# Patient Record
Sex: Male | Born: 1970 | Race: White | Hispanic: No | Marital: Married | Smoking: Never smoker
Health system: Southern US, Community
[De-identification: ages and names within clinical notes are randomized; demographics above are authoritative.]

## PROBLEM LIST (undated history)

## (undated) DIAGNOSIS — F988 Other specified behavioral and emotional disorders with onset usually occurring in childhood and adolescence: Secondary | ICD-10-CM

## (undated) DIAGNOSIS — I10 Essential (primary) hypertension: Secondary | ICD-10-CM

## (undated) HISTORY — PX: COLONOSCOPY: SHX174

## (undated) HISTORY — DX: Essential (primary) hypertension: I10

## (undated) HISTORY — DX: Other specified behavioral and emotional disorders with onset usually occurring in childhood and adolescence: F98.8

## (undated) HISTORY — PX: WISDOM TOOTH EXTRACTION: SHX21

## (undated) HISTORY — PX: LIPOMA EXCISION: SHX5283

---

## 2013-03-10 ENCOUNTER — Other Ambulatory Visit (HOSPITAL_COMMUNITY): Payer: Self-pay | Admitting: *Deleted

## 2013-03-10 ENCOUNTER — Ambulatory Visit (HOSPITAL_COMMUNITY)
Admission: RE | Admit: 2013-03-10 | Discharge: 2013-03-10 | Disposition: A | Discharge status: Home / Self Care | Payer: BC Managed Care – PPO | Source: Ambulatory Visit | Attending: *Deleted | Admitting: *Deleted

## 2013-03-10 DIAGNOSIS — R2232 Localized swelling, mass and lump, left upper limb: Secondary | ICD-10-CM

## 2013-03-10 DIAGNOSIS — R229 Localized swelling, mass and lump, unspecified: Secondary | ICD-10-CM | POA: Insufficient documentation

## 2013-03-11 ENCOUNTER — Ambulatory Visit (INDEPENDENT_AMBULATORY_CARE_PROVIDER_SITE_OTHER): Payer: BC Managed Care – PPO | Admitting: Surgery

## 2013-03-11 ENCOUNTER — Encounter (INDEPENDENT_AMBULATORY_CARE_PROVIDER_SITE_OTHER): Payer: Self-pay | Admitting: Surgery

## 2013-03-11 VITALS — BP 125/77 | HR 70 | Temp 97.8°F | Resp 14 | Ht 70.0 in | Wt 230.8 lb

## 2013-03-11 DIAGNOSIS — D171 Benign lipomatous neoplasm of skin and subcutaneous tissue of trunk: Secondary | ICD-10-CM

## 2013-03-11 DIAGNOSIS — D1779 Benign lipomatous neoplasm of other sites: Secondary | ICD-10-CM

## 2013-03-11 NOTE — Progress Notes (Signed)
Patient ID: Tom Wilson, male   DOB: [birth year], 42 y.o.   MRN: [id number]  Chief Complaint  Patient presents with  . Lipoma    HPI Tom Wilson is a 42 y.o. male.  PCP - Dr. Catalina Pizza HPI 42 yo male in good health presents with several weeks of waking up with some transient numbness in his left arm after sleeping on his left shoulder.  He has noticed a lump on his posterior left shoulder.  This was evaluated with an ultrasound and appears to be a subcutaneous lipoma.  He presents now to discuss excision.  Past Medical History  Diagnosis Date  . ADD (attention deficit disorder)     History reviewed. No pertinent past surgical history.  History reviewed. No pertinent family history.  Social History History  Substance Use Topics  . Smoking status: Never Smoker   . Smokeless tobacco: Not on file  . Alcohol Use: No    Not on File  Current Outpatient Prescriptions  Medication Sig Dispense Refill  . methylphenidate (CONCERTA) 27 MG CR tablet Take 27 mg by mouth every morning.      . methylphenidate (CONCERTA) 54 MG CR tablet Take 54 mg by mouth every morning.       No current facility-administered medications for this visit.    Review of Systems Review of Systems  Constitutional: Negative for fever, chills and unexpected weight change.  HENT: Negative for hearing loss, congestion, sore throat, trouble swallowing and voice change.   Eyes: Negative for visual disturbance.  Respiratory: Negative for cough and wheezing.   Cardiovascular: Negative for chest pain, palpitations and leg swelling.  Gastrointestinal: Negative for nausea, vomiting, abdominal pain, diarrhea, constipation, blood in stool, abdominal distention, anal bleeding and rectal pain.  Genitourinary: Negative for hematuria and difficulty urinating.  Musculoskeletal: Negative for arthralgias.  Skin: Negative for rash and wound.  Neurological: Negative for seizures, syncope, weakness and headaches.  Hematological:  Negative for adenopathy. Does not bruise/bleed easily.  Psychiatric/Behavioral: Negative for confusion.    Blood pressure 125/77, pulse 70, temperature 97.8 F (36.6 C), temperature source Temporal, resp. rate 14, height 5\' 10"  (1.778 m), weight 230 lb 12.8 oz (104.69 kg).  Physical Exam Physical Exam WDWN in NAD Left posterior shoulder - 4 cm palpable subcutaneous mass; no overlying skin changes  Data Reviewed RADIOLOGY REPORT*  Clinical Data: Soft tissue mass at left shoulder region  ULTRASOUND OF HEAD/NECK SOFT TISSUES  Technique: Ultrasound examination of the head and neck soft  tissues was performed in the area of clinical concern.  Comparison: None.  Findings:  At the site of clinical concern, a hypoechoic heterogeneous nodule  is identified measuring the 4.8 x 1.5 x 2.9 cm.  Mass has sonographic characteristics most typical of fat and is  most consistent with a subcutaneous lipoma.  Mass shows no significant vascularity on color Doppler imaging.  No calcifications or cystic foci identified.  IMPRESSION:  4.8 x 1.5 x 2.9 cm diameter heterogeneous subcutaneous mass at site  of palpable concern at the left shoulder region most consistent  with a subcutaneous lipoma.  Original Report Authenticated By: Ulyses Southward, M.D.   Assessment    Subcutaneous lipoma - left posterior shoulder     Plan    Excision under local anesthetic in the office.  We will schedule this for next week.         Denijah Karrer K. 03/11/2013, 11:50 AM

## 2013-03-17 ENCOUNTER — Encounter (INDEPENDENT_AMBULATORY_CARE_PROVIDER_SITE_OTHER): Payer: Self-pay | Admitting: Surgery

## 2013-03-17 ENCOUNTER — Ambulatory Visit (INDEPENDENT_AMBULATORY_CARE_PROVIDER_SITE_OTHER): Payer: BC Managed Care – PPO | Admitting: Surgery

## 2013-03-17 DIAGNOSIS — D1779 Benign lipomatous neoplasm of other sites: Secondary | ICD-10-CM

## 2013-03-17 DIAGNOSIS — D171 Benign lipomatous neoplasm of skin and subcutaneous tissue of trunk: Secondary | ICD-10-CM

## 2013-03-17 MED ORDER — HYDROCODONE-ACETAMINOPHEN 5-325 MG PO TABS: 1.0000 | ORAL_TABLET | ORAL | Status: DC | PRN

## 2013-03-17 NOTE — Progress Notes (Signed)
Pre-op Diagnosis:  Subcutaneous lipoma - left shoulder - 5 cm Post-op Diagnosis:  Same Procedure:  Excision of lipoma - posterior left shoulder Surgeon:  Arlinda Barcelona K. Anesthesia:  Local Indications:  43 yo male presents with slowly enlarging mass in posterior left shoulder with some pain/ numbness running down his left arm.  He presents for excision of lipoma.  Description of procedure:  The patient was positioned on his right side in the procedure room.  His left shoulder was prepped with Betadine and draped in sterile fashion. We anesthetized the skin and subcutaneous tissues around the lipoma with 0.25% Marcaine with epinephrine. We made a transverse incision across the lipoma. We dissected bluntly down to the surface of the lipoma. We then bluntly dissected are completely around the lipoma. We had to sharply dissect the capsule off of the underlying muscle. We were able to remove the entire lobulated lipoma. There was a small appendage of lipoma extending superiorly which was also removed. We inspected the cavity for hemostasis. The wound was closed with a deep layer of 3-0 Vicryl and a subcuticular layer of 4-0 Monocryl. Steri-Strips and an occlusive dressing were applied. The patient tolerated the procedure well.  Followup when necessary. He may remove the dressing in 2 days and may shower over the Steri-Strips. His wife is a Engineer, civil (consulting) and will contact us if there are any signs of infection.  Wilmon Arms. Corliss Skains, MD, [facility]  General/ Trauma Surgery  03/17/2013 12:09 PM

## 2013-10-20 ENCOUNTER — Emergency Department (HOSPITAL_COMMUNITY)
Admission: EM | Admit: 2013-10-20 | Discharge: 2013-10-20 | Disposition: A | Discharge status: Home / Self Care | Payer: BC Managed Care – PPO | Source: Home / Self Care | Attending: Family Medicine | Admitting: Family Medicine

## 2013-10-20 ENCOUNTER — Encounter (HOSPITAL_COMMUNITY): Payer: Self-pay | Admitting: Emergency Medicine

## 2013-10-20 DIAGNOSIS — S96819A Strain of other specified muscles and tendons at ankle and foot level, unspecified foot, initial encounter: Secondary | ICD-10-CM

## 2013-10-20 DIAGNOSIS — S86019A Strain of unspecified Achilles tendon, initial encounter: Secondary | ICD-10-CM

## 2013-10-20 DIAGNOSIS — S93499A Sprain of other ligament of unspecified ankle, initial encounter: Secondary | ICD-10-CM

## 2013-10-20 DIAGNOSIS — IMO0002 Reserved for concepts with insufficient information to code with codable children: Secondary | ICD-10-CM

## 2013-10-20 DIAGNOSIS — Y9375 Activity, martial arts: Secondary | ICD-10-CM

## 2013-10-20 NOTE — Discharge Instructions (Signed)
Thank you for coming in today. Keep the foot elevated if possible. Use the boot.  Followup with orthopedics soon.   Achilles Tendon Rupture  A complete tear in the Achilles tendon is known as an Achilles tendon rupture. The Achilles tendon, which is also known as the heel cord, connects the large calf muscles (gastrocnemius and soleus) to the heel bone (calcaneus) and is essential for proper functioning of the calf muscles. The calf muscles are required for pushing the foot downward and are necessary for walking, running, and jumping. SYMPTOMS   A "pop" or tear may be felt at the time of injury.  Pain and weakness during movement, especially when raising the heel.  Tenderness, swelling, warmth, and redness around the Achilles tendon.  Bruising (contusion) around the back of the ankle after 48 hours.  Loss of firmness to the touch over the area of the Achilles tendon that ruptured. CAUSES   Achilles tendon rupture is most commonly caused by a sudden force placed upon the Achilles tendon that is greater than the tendon can withstand (for example jumping, hurdling, or sprinting).  Achilles tendon rupture may also occur from direct trauma or injury to the lower leg, foot, or ankle. RISK INCREASES WITH:  Physical activity that involves sudden muscle contraction.  Poor strength and flexibility of the lower leg.  Previous injury to the tendon.  Untreated Achilles tendinitis.  Steroid injection into the Achilles tendon.  Medical conditions, such as poor circulation due to a cardiovascular condition or obesity. PREVENTION   Include a proper warm-up and stretching routine before physical activity.  Allow for rest and recovery between physical activity.  Maintain lower leg fitness:  Flexibility.  Muscle strength.  Muscular endurance.  Cardiovascular fitness.  Mechanical prevention:  Taping.  Protective strapping.  An adhesive bandaging that has been recommended prior  to physical activity. TREATMENT  Initially one should not walk on the affected leg. One should also ice the injured area, apply compression with an elastic bandage, and elevate the injured leg to eye level. Both surgical and nonsurgical interventions exist for definitive treatment. The return to sports is usually about the same with either treatment course but can occur a few weeks sooner with surgery.   Nonsurgical treatment typically requires the immobilization in the form of a long leg cast (from toes to groin) for 4 to 9 weeks. The long leg cast is followed by a short leg cast or walking boot for an additional 4 to 12 weeks.  Advantages: Non-surgical treatment lacks the risks involved with anesthesia or surgery (such as infection, bleeding, or injury to nerves).  Disadvantages: Non-surgical treatment involves a longer period of immobilization. This may result in stiffer ankle and knee joints. Also, the calf muscles are slightly weaker and the risk of repeat rupture is higher.  Surgical treatment typically requires sewing the ends of the tendon back together, followed by immobilization (typically a short leg cast or walking boot).  Advantages: Surgical treatment does not usually require the immobilization of the knee. Surgery also offers a lower risk of repeat rupture of the tendon and slightly stronger calf muscles.  Disadvantages: Surgical treatment does include the risks of anesthesia and surgery. These risks include impaired wound healing and injury to a nerve that provides sensation to the side of the foot. PROGNOSIS   Achilles tendon ruptures are typically curable if treated correctly.  A period of 4 to 9 months is typical before a return to sports. RELATED COMPLICATIONS   Calf muscle  weakness may occur, especially if the rupture goes untreated.  The possibility of repeat rupture exists despite treatment.  Prolonged disability can occur.  Risks of surgery include infection,  bleeding, injury to nerves, and impaired wound healing. MEDICATION   If pain medication is necessary, then nonsteroidal anti-inflammatory medications, such as aspirin and ibuprofen, or other minor pain relievers, such as acetaminophen, are often recommended.  Do not take pain medication within 7 days before surgery.  All medications should be taken under the direction of your caregiver. Contact your caregiver immediately if any bleeding, stomach upset, or signs of allergic reaction occur.  Prescription pain medication may be prescribed as necessary by your caregiver. Use only as directed and only as much as you need. SEEK MEDICAL CARE IF:   Pain continues to increase, despite treatment.  Cast discomfort develops.  New, unexplained symptoms develop.  You are experiencing any side effects form the drugs used in treatment.

## 2013-10-20 NOTE — ED Provider Notes (Signed)
Tom Wilson is a 43 y.o. male who presents to Urgent Care today for Achilles tendon. Patient was sparring in [place] today. He planted with his left foot and felt a pop in the distal Achilles tendon. He had immediate pain and inability to cannulate. He's concerned he may have ruptured his Achilles tendon. He denies any radiating pain weakness or numbness. He denies any prior injury. He's tried ice and use crutches to ambulate. He has not tried any medications yet.   Past Medical History  Diagnosis Date  . ADD (attention deficit disorder)    History  Substance Use Topics  . Smoking status: Never Smoker   . Smokeless tobacco: Not on file  . Alcohol Use: No   ROS as above Medications: No current facility-administered medications for this encounter.   Current Outpatient Prescriptions  Medication Sig Dispense Refill  . HYDROcodone-acetaminophen (NORCO/VICODIN) 5-325 MG per tablet Take 1 tablet by mouth every 4 (four) hours as needed for pain.  40 tablet  0  . methylphenidate (CONCERTA) 27 MG CR tablet Take 27 mg by mouth every morning.      . methylphenidate (CONCERTA) 54 MG CR tablet Take 54 mg by mouth every morning.        Exam:  BP 140/94  Pulse 65  Temp(Src) 98.3 F (36.8 C) (Oral)  Resp 18  SpO2 98% Gen: Well NAD Left Achilles tendon. No ecchymosis present. Palpable defect approximately 5 cm proximal to the insertion at the calcaneus. No foot motion with calf squeeze. Capillary refill sensation and pulses are intact distally.  Limited musculoskeletal ultrasound left Achilles tendon: Full-thickness defect seen about 5 cm proximal to the calcaneal insertion consistent with Achilles tendon rupture    Assessment and Plan: 43 y.o. male with Achilles tendon rupture. Plan for cam walker boot, crutches, followup with orthopedics in.  Discussed warning signs or symptoms. Please see discharge instructions. Patient expresses understanding.    Gregor Hams, MD 10/20/13 [phone]

## 2013-10-20 NOTE — ED Notes (Signed)
C/o left ankle injury See physician note

## 2013-11-01 ENCOUNTER — Encounter (HOSPITAL_BASED_OUTPATIENT_CLINIC_OR_DEPARTMENT_OTHER): Payer: Self-pay | Admitting: *Deleted

## 2013-11-01 NOTE — Progress Notes (Signed)
No health problems  

## 2013-11-02 ENCOUNTER — Other Ambulatory Visit: Payer: Self-pay | Admitting: Orthopedic Surgery

## 2013-11-02 NOTE — H&P (Signed)
Tom Wilson is an 43 y.o. male.   Chief Complaint: Left posterior ankle pain HPI: Pt reports to OR for surgical repair of his left achilles tendon rupture. Pt was in Taekwondo practice when he felt a sharp stabbing pain to the left achilles.  Pt denies N/V/F/C, chest pain, SOB, calf pain, or paresthesia b/l.  Past Medical History  Diagnosis Date  . ADD (attention deficit disorder)     Past Surgical History  Procedure Laterality Date  . Colonoscopy    . Wisdom tooth extraction    . Lipoma excision      History reviewed. No pertinent family history. Social History:  reports that he has never smoked. He does not have any smokeless tobacco history on file. He reports that he does not drink alcohol or use illicit drugs.  Allergies: No Known Allergies  No prescriptions prior to admission    No results found for this or any previous visit (from the past 48 hour(s)). No results found.  Review of Systems  Constitutional: Negative.   HENT: Negative.   Eyes: Negative.   Respiratory: Negative.   Cardiovascular: Negative.   Gastrointestinal: Negative.   Musculoskeletal: Negative.   Skin: Negative.   Neurological: Negative for tremors.  Endo/Heme/Allergies: Negative.   Psychiatric/Behavioral: The patient is not nervous/anxious.     Height 5\' 10"  (1.778 m), weight 104.327 kg (230 lb). Physical Exam  WD WN 43y/o male in NAD, A/Ox3, appears stated age.  EOMI, mood and affect normal, respirations unlabored. Pt is non-ambulatory due to pain. +TTP to area of posterior left ankle. Palpable defect noted just below the musculo-tendonus junction of the left gastroc and achilles. +Soft tissue edema noted to area of palbale defect. +Thompson test. Dorsalis pedis pulses 2+ b/l. Normal sensation to light touch intact.  Skin healthy and intact, no lymphadenopathy noted on exam. Assessment/Plan Pt reports to OR for surgical repair of left achilles rupture.   Judeth Cornfield Larkyn Greenberger 11/02/2013, 10:24  AM

## 2013-11-03 ENCOUNTER — Encounter (HOSPITAL_BASED_OUTPATIENT_CLINIC_OR_DEPARTMENT_OTHER): Payer: BC Managed Care – PPO | Admitting: Certified Registered"

## 2013-11-03 ENCOUNTER — Encounter (HOSPITAL_BASED_OUTPATIENT_CLINIC_OR_DEPARTMENT_OTHER)
Admission: RE | Disposition: A | Discharge status: Home / Self Care | Payer: Self-pay | Source: Ambulatory Visit | Attending: Orthopedic Surgery

## 2013-11-03 ENCOUNTER — Ambulatory Visit (HOSPITAL_BASED_OUTPATIENT_CLINIC_OR_DEPARTMENT_OTHER): Payer: BC Managed Care – PPO | Admitting: Certified Registered"

## 2013-11-03 ENCOUNTER — Ambulatory Visit (HOSPITAL_BASED_OUTPATIENT_CLINIC_OR_DEPARTMENT_OTHER)
Admission: RE | Admit: 2013-11-03 | Discharge: 2013-11-03 | Disposition: A | Discharge status: Home / Self Care | Payer: BC Managed Care – PPO | Source: Ambulatory Visit | Attending: Orthopedic Surgery | Admitting: Orthopedic Surgery

## 2013-11-03 ENCOUNTER — Encounter (HOSPITAL_BASED_OUTPATIENT_CLINIC_OR_DEPARTMENT_OTHER): Payer: Self-pay

## 2013-11-03 DIAGNOSIS — X500XXA Overexertion from strenuous movement or load, initial encounter: Secondary | ICD-10-CM | POA: Insufficient documentation

## 2013-11-03 DIAGNOSIS — Y9375 Activity, martial arts: Secondary | ICD-10-CM | POA: Insufficient documentation

## 2013-11-03 DIAGNOSIS — S93499A Sprain of other ligament of unspecified ankle, initial encounter: Secondary | ICD-10-CM | POA: Insufficient documentation

## 2013-11-03 DIAGNOSIS — S96819A Strain of other specified muscles and tendons at ankle and foot level, unspecified foot, initial encounter: Principal | ICD-10-CM

## 2013-11-03 DIAGNOSIS — F988 Other specified behavioral and emotional disorders with onset usually occurring in childhood and adolescence: Secondary | ICD-10-CM | POA: Insufficient documentation

## 2013-11-03 DIAGNOSIS — S86012A Strain of left Achilles tendon, initial encounter: Secondary | ICD-10-CM

## 2013-11-03 HISTORY — PX: ACHILLES TENDON SURGERY: SHX542

## 2013-11-03 LAB — POCT HEMOGLOBIN-HEMACUE: HEMOGLOBIN: 13.7 g/dL (ref 13.0–17.0)

## 2013-11-03 SURGERY — REPAIR, TENDON, ACHILLES
Anesthesia: Regional | Site: Foot | Laterality: Left

## 2013-11-03 MED ORDER — 0.9 % SODIUM CHLORIDE (POUR BTL) OPTIME
TOPICAL | Status: DC | PRN
  Administered 2013-11-03: 200 mL

## 2013-11-03 MED ORDER — HYDROCODONE-ACETAMINOPHEN 5-325 MG PO TABS: 1.0000 | ORAL_TABLET | Freq: Four times a day (QID) | ORAL | Status: DC | PRN

## 2013-11-03 MED ORDER — CEFAZOLIN SODIUM-DEXTROSE 2-3 GM-% IV SOLR
INTRAVENOUS | Status: AC
  Filled 2013-11-03: qty 50

## 2013-11-03 MED ORDER — MIDAZOLAM HCL 2 MG/2ML IJ SOLN
1.0000 mg | INTRAMUSCULAR | Status: DC | PRN
  Administered 2013-11-03: 2 mg via INTRAVENOUS

## 2013-11-03 MED ORDER — ONDANSETRON HCL 4 MG/2ML IJ SOLN
INTRAMUSCULAR | Status: DC | PRN
  Administered 2013-11-03: 4 mg via INTRAVENOUS

## 2013-11-03 MED ORDER — PROPOFOL 10 MG/ML IV EMUL
INTRAVENOUS | Status: AC
  Filled 2013-11-03: qty 50

## 2013-11-03 MED ORDER — CHLORHEXIDINE GLUCONATE 4 % EX LIQD: 60.0000 mL | Freq: Once | CUTANEOUS | Status: DC

## 2013-11-03 MED ORDER — CEFAZOLIN SODIUM-DEXTROSE 2-3 GM-% IV SOLR
2.0000 g | INTRAVENOUS | Status: AC
  Administered 2013-11-03: 2 g via INTRAVENOUS

## 2013-11-03 MED ORDER — BUPIVACAINE HCL (PF) 0.5 % IJ SOLN
INTRAMUSCULAR | Status: DC | PRN
  Administered 2013-11-03: 10 mL

## 2013-11-03 MED ORDER — DEXAMETHASONE SODIUM PHOSPHATE 4 MG/ML IJ SOLN
INTRAMUSCULAR | Status: DC | PRN
  Administered 2013-11-03: 10 mg via INTRAVENOUS

## 2013-11-03 MED ORDER — OXYCODONE HCL 5 MG/5ML PO SOLN: 5.0000 mg | Freq: Once | ORAL | Status: DC | PRN

## 2013-11-03 MED ORDER — FENTANYL CITRATE 0.05 MG/ML IJ SOLN
INTRAMUSCULAR | Status: AC
  Filled 2013-11-03: qty 2

## 2013-11-03 MED ORDER — PROPOFOL 10 MG/ML IV BOLUS
INTRAVENOUS | Status: DC | PRN
  Administered 2013-11-03: 200 mg via INTRAVENOUS

## 2013-11-03 MED ORDER — MIDAZOLAM HCL 2 MG/2ML IJ SOLN
INTRAMUSCULAR | Status: AC
  Filled 2013-11-03: qty 2

## 2013-11-03 MED ORDER — HYDROMORPHONE HCL PF 1 MG/ML IJ SOLN: 0.2500 mg | INTRAMUSCULAR | Status: DC | PRN

## 2013-11-03 MED ORDER — LIDOCAINE HCL (CARDIAC) 20 MG/ML IV SOLN
INTRAVENOUS | Status: DC | PRN
  Administered 2013-11-03: 30 mg via INTRAVENOUS

## 2013-11-03 MED ORDER — SODIUM CHLORIDE 0.9 % IV SOLN: INTRAVENOUS | Status: DC

## 2013-11-03 MED ORDER — SUCCINYLCHOLINE CHLORIDE 20 MG/ML IJ SOLN
INTRAMUSCULAR | Status: DC | PRN
  Administered 2013-11-03: 100 mg via INTRAVENOUS

## 2013-11-03 MED ORDER — BUPIVACAINE-EPINEPHRINE (PF) 0.5% -1:200000 IJ SOLN
INTRAMUSCULAR | Status: DC | PRN
  Administered 2013-11-03: 30 mL

## 2013-11-03 MED ORDER — FENTANYL CITRATE 0.05 MG/ML IJ SOLN
50.0000 ug | INTRAMUSCULAR | Status: DC | PRN
  Administered 2013-11-03: 100 ug via INTRAVENOUS

## 2013-11-03 MED ORDER — BUPIVACAINE-EPINEPHRINE PF 0.5-1:200000 % IJ SOLN
INTRAMUSCULAR | Status: AC
  Filled 2013-11-03: qty 30

## 2013-11-03 MED ORDER — OXYCODONE HCL 5 MG PO TABS: 5.0000 mg | ORAL_TABLET | Freq: Once | ORAL | Status: DC | PRN

## 2013-11-03 MED ORDER — MIDAZOLAM HCL 5 MG/5ML IJ SOLN
INTRAMUSCULAR | Status: DC | PRN
  Administered 2013-11-03: 2 mg via INTRAVENOUS

## 2013-11-03 MED ORDER — FENTANYL CITRATE 0.05 MG/ML IJ SOLN
INTRAMUSCULAR | Status: AC
  Filled 2013-11-03: qty 6

## 2013-11-03 MED ORDER — BACITRACIN ZINC 500 UNIT/GM EX OINT
TOPICAL_OINTMENT | CUTANEOUS | Status: DC | PRN
  Administered 2013-11-03: 1 via TOPICAL

## 2013-11-03 MED ORDER — LACTATED RINGERS IV SOLN
INTRAVENOUS | Status: DC
  Administered 2013-11-03 (×2): via INTRAVENOUS

## 2013-11-03 SURGICAL SUPPLY — 74 items
BANDAGE ESMARK 6X9 LF (GAUZE/BANDAGES/DRESSINGS) ×1 IMPLANT
BLADE 15 SAFETY STRL DISP (BLADE) ×2 IMPLANT
BLADE AVERAGE 25X9 (BLADE) IMPLANT
BNDG COHESIVE 4X5 TAN STRL (GAUZE/BANDAGES/DRESSINGS) ×2 IMPLANT
BNDG COHESIVE 6X5 TAN STRL LF (GAUZE/BANDAGES/DRESSINGS) ×2 IMPLANT
BNDG ESMARK 6X9 LF (GAUZE/BANDAGES/DRESSINGS) ×2
BOOT STEPPER DURA MED (SOFTGOODS) IMPLANT
CANISTER SUCT 1200ML W/VALVE (MISCELLANEOUS) ×2 IMPLANT
CHLORAPREP W/TINT 26ML (MISCELLANEOUS) ×2 IMPLANT
COVER TABLE BACK 60X90 (DRAPES) ×2 IMPLANT
CUFF TOURNIQUET SINGLE 34IN LL (TOURNIQUET CUFF) ×2 IMPLANT
DRAPE EXTREMITY T 121X128X90 (DRAPE) ×2 IMPLANT
DRAPE OEC MINIVIEW 54X84 (DRAPES) IMPLANT
DRAPE U-SHAPE 47X51 STRL (DRAPES) ×2 IMPLANT
DRSG EMULSION OIL 3X3 NADH (GAUZE/BANDAGES/DRESSINGS) ×2 IMPLANT
DRSG PAD ABDOMINAL 8X10 ST (GAUZE/BANDAGES/DRESSINGS) ×4 IMPLANT
ELECT REM PT RETURN 9FT ADLT (ELECTROSURGICAL) ×2
ELECTRODE REM PT RTRN 9FT ADLT (ELECTROSURGICAL) ×1 IMPLANT
GAUZE SPONGE 4X4 12PLY STRL (GAUZE/BANDAGES/DRESSINGS) ×2 IMPLANT
GLOVE BIO SURGEON STRL SZ8 (GLOVE) ×2 IMPLANT
GLOVE BIOGEL PI IND STRL 7.0 (GLOVE) ×1 IMPLANT
GLOVE BIOGEL PI IND STRL 7.5 (GLOVE) IMPLANT
GLOVE BIOGEL PI IND STRL 8 (GLOVE) ×1 IMPLANT
GLOVE BIOGEL PI INDICATOR 7.0 (GLOVE) ×1
GLOVE BIOGEL PI INDICATOR 7.5 (GLOVE)
GLOVE BIOGEL PI INDICATOR 8 (GLOVE) ×1
GLOVE ECLIPSE 6.5 STRL STRAW (GLOVE) ×2 IMPLANT
GLOVE ECLIPSE 7.0 STRL STRAW (GLOVE) IMPLANT
GLOVE EXAM NITRILE MD LF STRL (GLOVE) ×2 IMPLANT
GOWN STRL REUS W/ TWL LRG LVL3 (GOWN DISPOSABLE) ×1 IMPLANT
GOWN STRL REUS W/ TWL XL LVL3 (GOWN DISPOSABLE) ×1 IMPLANT
GOWN STRL REUS W/TWL LRG LVL3 (GOWN DISPOSABLE) ×1
GOWN STRL REUS W/TWL XL LVL3 (GOWN DISPOSABLE) ×1
KIT BIO-TENODESIS 3X8 DISP (MISCELLANEOUS)
KIT IMPLANT SUTURE PARS (Kit) ×2 IMPLANT
KIT INSRT BABSR STRL DISP BTN (MISCELLANEOUS) IMPLANT
NDL SAFETY ECLIPSE 18X1.5 (NEEDLE) IMPLANT
NDL SUT 6 .5 CRC .975X.05 MAYO (NEEDLE) IMPLANT
NEEDLE HYPO 18GX1.5 SHARP (NEEDLE)
NEEDLE HYPO 22GX1.5 SAFETY (NEEDLE) IMPLANT
NEEDLE MAYO TAPER (NEEDLE)
PACK BASIN DAY SURGERY FS (CUSTOM PROCEDURE TRAY) ×2 IMPLANT
PAD CAST 4YDX4 CTTN HI CHSV (CAST SUPPLIES) ×1 IMPLANT
PADDING CAST ABS 4INX4YD NS (CAST SUPPLIES)
PADDING CAST ABS COTTON 4X4 ST (CAST SUPPLIES) IMPLANT
PADDING CAST COTTON 4X4 STRL (CAST SUPPLIES) ×1
PADDING CAST COTTON 6X4 STRL (CAST SUPPLIES) ×2 IMPLANT
PENCIL BUTTON HOLSTER BLD 10FT (ELECTRODE) ×2 IMPLANT
SANITIZER HAND PURELL 535ML FO (MISCELLANEOUS) ×2 IMPLANT
SHEET MEDIUM DRAPE 40X70 STRL (DRAPES) ×2 IMPLANT
SLEEVE SCD COMPRESS KNEE MED (MISCELLANEOUS) ×2 IMPLANT
SPLINT FAST PLASTER 5X30 (CAST SUPPLIES) ×20
SPLINT PLASTER CAST FAST 5X30 (CAST SUPPLIES) ×20 IMPLANT
SPONGE LAP 18X18 X RAY DECT (DISPOSABLE) ×2 IMPLANT
STAPLER VISISTAT 35W (STAPLE) IMPLANT
STOCKINETTE 6  STRL (DRAPES) ×1
STOCKINETTE 6 STRL (DRAPES) ×1 IMPLANT
SUCTION FRAZIER TIP 10 FR DISP (SUCTIONS) IMPLANT
SUT 2 FIBERLOOP 20 STRT BLUE (SUTURE)
SUT ETHIBOND 2 OS 4 DA (SUTURE) ×2 IMPLANT
SUT ETHILON 3 0 PS 1 (SUTURE) ×2 IMPLANT
SUT FIBERWIRE #2 38 T-5 BLUE (SUTURE)
SUT MNCRL AB 3-0 PS2 18 (SUTURE) ×2 IMPLANT
SUT VIC AB 0 SH 27 (SUTURE) ×2 IMPLANT
SUT VIC AB 2-0 SH 27 (SUTURE)
SUT VIC AB 2-0 SH 27XBRD (SUTURE) IMPLANT
SUT VICRYL 4-0 PS2 18IN ABS (SUTURE) IMPLANT
SUTURE 2 FIBERLOOP 20 STRT BLU (SUTURE) IMPLANT
SUTURE FIBERWR #2 38 T-5 BLUE (SUTURE) IMPLANT
SYR BULB 3OZ (MISCELLANEOUS) ×2 IMPLANT
TOWEL OR 17X24 6PK STRL BLUE (TOWEL DISPOSABLE) ×2 IMPLANT
TOWEL OR NON WOVEN STRL DISP B (DISPOSABLE) IMPLANT
TUBE CONNECTING 20X1/4 (TUBING) ×2 IMPLANT
UNDERPAD 30X30 INCONTINENT (UNDERPADS AND DIAPERS) ×2 IMPLANT

## 2013-11-03 NOTE — Anesthesia Preprocedure Evaluation (Addendum)
Anesthesia Evaluation  Patient identified by MRN, date of birth, ID band Patient awake    Reviewed: Allergy & Precautions, H&P , NPO status , Patient's Chart, lab work & pertinent test results  Airway Mallampati: II TM Distance: >3 FB Neck ROM: Full    Dental no notable dental hx. (+) Teeth Intact, Dental Advisory Given   Pulmonary neg pulmonary ROS,  breath sounds clear to auscultation  Pulmonary exam normal       Cardiovascular negative cardio ROS  Rhythm:Regular Rate:Normal     Neuro/Psych negative neurological ROS  negative psych ROS   GI/Hepatic negative GI ROS, Neg liver ROS,   Endo/Other  negative endocrine ROS  Renal/GU negative Renal ROS  negative genitourinary   Musculoskeletal   Abdominal   Peds  (+) ATTENTION DEFICIT DISORDER WITHOUT HYPERACTIVITY Hematology negative hematology ROS (+)   Anesthesia Other Findings   Reproductive/Obstetrics negative OB ROS                          Anesthesia Physical Anesthesia Plan  ASA: II  Anesthesia Plan: General and Regional   Post-op Pain Management:    Induction: Intravenous  Airway Management Planned: Oral ETT  Additional Equipment:   Intra-op Plan:   Post-operative Plan: Extubation in OR  Informed Consent: I have reviewed the patients History and Physical, chart, labs and discussed the procedure including the risks, benefits and alternatives for the proposed anesthesia with the patient or authorized representative who has indicated his/her understanding and acceptance.   Dental advisory given  Plan Discussed with: CRNA  Anesthesia Plan Comments:         Anesthesia Quick Evaluation

## 2013-11-03 NOTE — Discharge Instructions (Signed)
Wylene Simmer, MD [place]  Please read the following information regarding your care after surgery.  Medications  You only need a prescription for the narcotic pain medicine (ex. oxycodone, Percocet, Norco).  All of the other medicines listed below are available over the counter. X acetominophen (Tylenol) 650 mg every 4-6 hours as you need for minor pain OR Norco as prescribed for moderate to severe pain  Narcotic pain medicine (ex. oxycodone, Percocet, Vicodin) will cause constipation.  To prevent this problem, take the following medicines while you are taking any pain medicine. X docusate sodium (Colace) 100 mg twice a day X senna (Senokot) 2 tablets twice a day  X To help prevent blood clots, take an aspirin (325 mg) once a day for a month after surgery.  You should also get up every hour while you are awake to move around.    Weight Bearing ? Bear weight when you are able on your operated leg or foot. ? Bear weight only on the heel of your operated foot in the post-op shoe. X Do not bear any weight on the operated leg or foot.  Cast / Splint / Dressing X Keep your splint or cast clean and dry.  Dont put anything (coat hanger, pencil, etc) down inside of it.  If it gets damp, use a hair dryer on the cool setting to dry it.  If it gets soaked, call the office to schedule an appointment for a cast change. ? Remove your dressing 3 days after surgery and cover the incisions with dry dressings.    After your dressing, cast or splint is removed; you may shower, but do not soak or scrub the wound.  Allow the water to run over it, and then gently pat it dry.  Swelling It is normal for you to have swelling where you had surgery.  To reduce swelling and pain, keep your toes above your nose for at least 3 days after surgery.  It may be necessary to keep your foot or leg elevated for several weeks.  If it hurts, it should be elevated.  Follow Up Call my office at [phone]  when you are discharged from the hospital or surgery center to schedule an appointment to be seen two weeks after surgery.  Call my office at [phone] if you develop a fever >101.5 F, nausea, vomiting, bleeding from the surgical site or severe pain.     Post Anesthesia Home Care Instructions  Activity: Get plenty of rest for the remainder of the day. A responsible adult should stay with you for 24 hours following the procedure.  For the next 24 hours, DO NOT: -Drive a car -Paediatric nurse -Drink alcoholic beverages -Take any medication unless instructed by your physician -Make any legal decisions or sign important papers.  Meals: Start with liquid foods such as gelatin or soup. Progress to regular foods as tolerated. Avoid greasy, spicy, heavy foods. If nausea and/or vomiting occur, drink only clear liquids until the nausea and/or vomiting subsides. Call your physician if vomiting continues.  Special Instructions/Symptoms: Your throat may feel dry or sore from the anesthesia or the breathing tube placed in your throat during surgery. If this causes discomfort, gargle with warm salt water. The discomfort should disappear within 24 hours  . Regional Anesthesia Blocks  1. Numbness or the inability to move the "blocked" extremity may last from 3-48 hours after placement. The length of time depends on the medication injected and your individual response to the medication. If  the numbness is not going away after 48 hours, call your surgeon.  2. The extremity that is blocked will need to be protected until the numbness is gone and the  Strength has returned. Because you cannot feel it, you will need to take extra care to avoid injury. Because it may be weak, you may have difficulty moving it or using it. You may not know what position it is in without looking at it while the block is in effect.  3. For blocks in the legs and feet, returning to weight bearing and walking needs to be done  carefully. You will need to wait until the numbness is entirely gone and the strength has returned. You should be able to move your leg and foot normally before you try and bear weight or walk. You will need someone to be with you when you first try to ensure you do not fall and possibly risk injury.  4. Bruising and tenderness at the needle site are common side effects and will resolve in a few days.  5. Persistent numbness or new problems with movement should be communicated to the surgeon or the [place] [phone] [place] [phone]).

## 2013-11-03 NOTE — Anesthesia Postprocedure Evaluation (Signed)
  Anesthesia Post-op Note  Patient: Tom Wilson  Procedure(s) Performed: Procedure(s): LEFT ACHILLES TENDON REPAIR (Left)  Patient Location: PACU  Anesthesia Type:General and Popliteal block  Level of Consciousness: awake and alert   Airway and Oxygen Therapy: Patient Spontanous Breathing  Post-op Pain: none  Post-op Assessment: Post-op Vital signs reviewed, Patient's Cardiovascular Status Stable and Respiratory Function Stable  Post-op Vital Signs: Reviewed  Filed Vitals:   11/03/13 1130  BP: 129/81  Pulse: 77  Temp:   Resp: 17    Complications: No apparent anesthesia complications

## 2013-11-03 NOTE — Progress Notes (Signed)
Assisted Dr. Fitzgerald with left, ultrasound guided, popliteal/saphenous block. Side rails up, monitors on throughout procedure. See vital signs in flow sheet. Tolerated Procedure well. 

## 2013-11-03 NOTE — Transfer of Care (Signed)
Immediate Anesthesia Transfer of Care Note  Patient: Tom Wilson  Procedure(s) Performed: Procedure(s): LEFT ACHILLES TENDON REPAIR (Left)  Patient Location: PACU  Anesthesia Type:GA combined with regional for post-op pain  Level of Consciousness: awake, alert , oriented and patient cooperative  Airway & Oxygen Therapy: Patient Spontanous Breathing and Patient connected to face mask oxygen  Post-op Assessment: Report given to PACU RN and Post -op Vital signs reviewed and stable  Post vital signs: Reviewed and stable  Complications: No apparent anesthesia complications

## 2013-11-03 NOTE — H&P (Signed)
Agree with note above.  To OR for left achilles repair.  The risks and benefits of the alternative treatment options have been discussed in detail.  The patient wishes to proceed with surgery and specifically understands risks of bleeding, infection, nerve damage, blood clots, need for additional surgery, amputation and death.

## 2013-11-03 NOTE — Anesthesia Procedure Notes (Addendum)
Anesthesia Regional Block:  Popliteal block  Pre-Anesthetic Checklist: ,, timeout performed, Correct Patient, Correct Site, Correct Laterality, Correct Procedure, Correct Position, site marked, Risks and benefits discussed, pre-op evaluation, post-op pain management  Laterality: Left  Prep: Maximum Sterile Barrier Precautions used and chloraprep       Needles:  Injection technique: Single-shot  Needle Type: Echogenic Stimulator Needle     Needle Length: 9cm 9 cm Needle Gauge: 21 and 21 G    Additional Needles:  Procedures: ultrasound guided (picture in chart) and nerve stimulator Popliteal block  Nerve Stimulator or Paresthesia:  Response: Peroneal,  Response: Tibial,   Additional Responses:   Narrative:  Start time: 11/03/2013 8:20 AM End time: 11/03/2013 8:33 AM Injection made incrementally with aspirations every 5 mL. Anesthesiologist: Ola Spurr, MD  Additional Notes: 2% Lidocaine skin wheel. Saphenous block with 10cc of 0.5% Bupivicaine plain.   Procedure Name: Intubation Date/Time: 11/03/2013 9:18 AM Performed by: Thurston Brendlinger Pre-anesthesia Checklist: Patient identified, Emergency Drugs available, Suction available and Patient being monitored Patient Re-evaluated:Patient Re-evaluated prior to inductionOxygen Delivery Method: Circle System Utilized Preoxygenation: Pre-oxygenation with 100% oxygen Intubation Type: IV induction Ventilation: Mask ventilation without difficulty Laryngoscope Size: Mac and 3 Grade View: Grade I Tube type: Oral Tube size: 7.0 mm Number of attempts: 1 Airway Equipment and Method: stylet Placement Confirmation: ETT inserted through vocal cords under direct vision,  positive ETCO2 and breath sounds checked- equal and bilateral Secured at: 22 cm Tube secured with: Tape Dental Injury: Teeth and Oropharynx as per pre-operative assessment

## 2013-11-03 NOTE — Brief Op Note (Signed)
11/03/2013  10:44 AM  PATIENT:  Tom Wilson  43 y.o. male  PRE-OPERATIVE DIAGNOSIS:  LEFT ACHILLES TENDON RUPTURE  POST-OPERATIVE DIAGNOSIS:  LEFT ACHILLES TENDON RUPTURE  Procedure(s): LEFT ACHILLES TENDON REPAIR  SURGEON:  Wylene Simmer, MD  ASSISTANT: n/a  ANESTHESIA:   General, regional  EBL:  minimal   TOURNIQUET:   Total Tourniquet Time Documented: Thigh (Left) - 66 minutes Total: Thigh (Left) - 66 minutes  COMPLICATIONS:  None apparent  DISPOSITION:  Extubated, awake and stable to recovery.  DICTATION ID:  [id number]

## 2013-11-04 ENCOUNTER — Encounter (HOSPITAL_BASED_OUTPATIENT_CLINIC_OR_DEPARTMENT_OTHER): Payer: Self-pay | Admitting: Orthopedic Surgery

## 2013-11-07 NOTE — Op Note (Signed)
NAMESMITH, POTENZA                 ACCOUNT NO.:  [id number]  MEDICAL RECORD NO.:  [id number]  LOCATION:                                 FACILITY:  PHYSICIAN:  Wylene Simmer, MD        DATE OF BIRTH:  Feb 02, 1971  DATE OF PROCEDURE:  11/03/2013 DATE OF DISCHARGE:  11/03/2013                              OPERATIVE REPORT   PREOPERATIVE DIAGNOSIS:  Left Achilles tendon rupture.  POSTOPERATIVE DIAGNOSIS:  Left Achilles tendon rupture.  PROCEDURE:  Left Achilles tendon repair.  SURGEON:  Wylene Simmer, MD  ANESTHESIA:  General, regional.  ESTIMATED BLOOD LOSS:  Minimal.  TOURNIQUET TIME:  66 minutes at 220 mmHg.  COMPLICATIONS:  None apparent.  DISPOSITION:  Extubated, awake and stable to recovery.  INDICATIONS FOR PROCEDURE:  The patient is a 43 year old male who injured his left ankle in taekwondo.  He was found on physical examination to have an Achilles tendon rupture.  He presents now for operative treatment of this injury.  He understands the risks and benefits, the alternative treatment options and elects surgical treatment.  He specifically understands risks of bleeding, infection, nerve damage, blood clots, need for additional surgery, continued pain, amputation and death.  PROCEDURE IN DETAIL:  After preoperative consent was obtained and the correct operative site was identified, the patient was brought to the operating room supine on a stretcher.  General anesthesia was induced. Preoperative antibiotics were administered.  Surgical time-out was taken.  The left lower extremity was exsanguinated and the thigh tourniquet was inflated to 220 mmHg.  The patient was then turned into the prone position on the operating table with all bony prominences padded well.  The left lower extremity was then prepped and draped in standard sterile fashion.  The patient's Achilles rupture was identified by palpation.  A longitudinal incision was made over this area.  Sharp dissection  was carried down through the skin and subcutaneous tissue and peritenon.  The rupture was identified.  The Arthrex PARS device was then inserted into the peritenon proximally.  The stump was pulled down between the arms of the device with a tagging suture.  Sutures were then passed through the device and out through the peritenon creating two box- type sutures and one locking suture.  The suture ends were tagged and placed out of the way.  This was repeated for the distal end of the stump.  The ankle was then held in plantar flexion while the sutures were tied to each other in pairwise fashion.  The patient's [place] Silos test was then noted to be normal.  Wound was irrigated.  The sutures were all cut and the peritenon was closed with simple sutures of 3-0 Monocryl.  The subcutaneous tissue was approximated with inverted simple sutures of 3-0 Monocryl.  A running 3-0 nylon was used to close the skin incision.  Sterile dressings were applied followed by a well-padded short-leg splint with the ankle in gravity equinus.  The tourniquet was released at 66 minutes after application of the dressings.  The patient was then awakened from anesthesia and transported to the recovery room in stable condition.  FOLLOWUP PLAN:  The patient  will be nonweightbearing on the left lower extremity.  He will follow up with me in 2 weeks for suture removal.  He will start on aspirin daily for DVT prophylaxis.     Wylene Simmer, MD   ______________________________ Wylene Simmer, MD    JH/MEDQ  D:  11/03/2013  T:  11/04/2013  Job:  [id number]

## 2014-06-20 IMAGING — US US MISC SOFT TISSUE
1 series · 14 of 16 positions shown · non-contrast
Comparison: None.

CLINICAL DATA: Soft tissue mass at left shoulder region

ULTRASOUND OF HEAD/NECK SOFT TISSUES
TECHNIQUE: Ultrasound examination of the head and neck soft
tissues was performed in the area of clinical concern.

[Series 1: us misc soft tissue · 0.05mm/px · 14 of 27 slices shown]
[im 1/27]
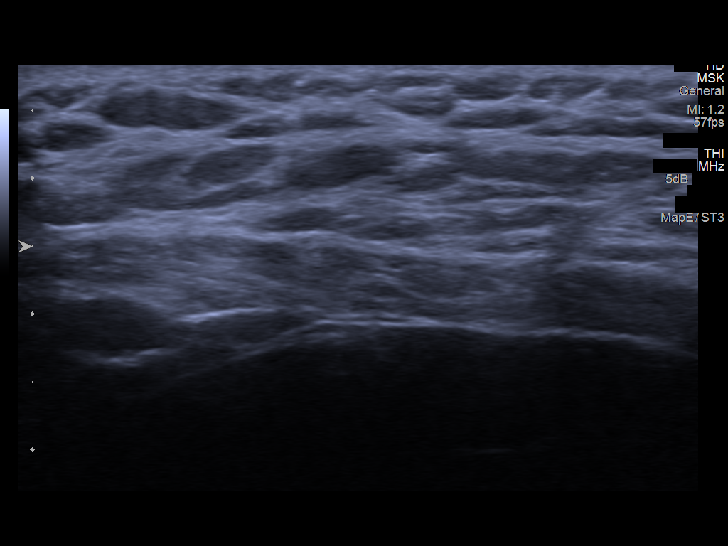
[im 2/27]
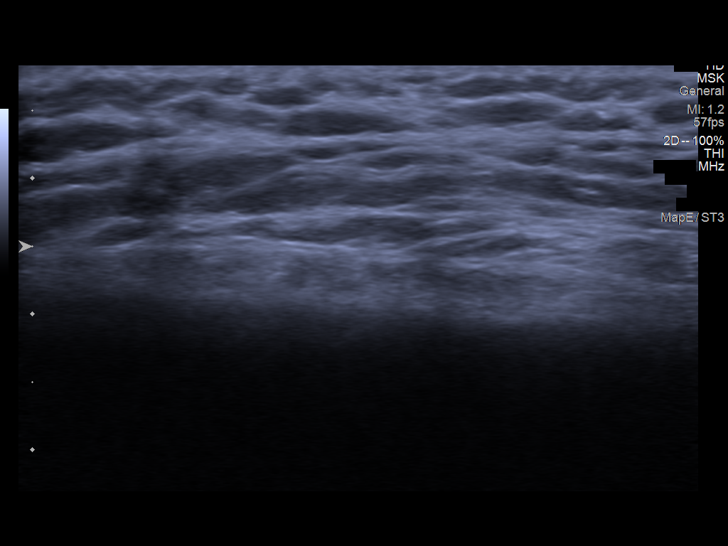
[im 4/27]
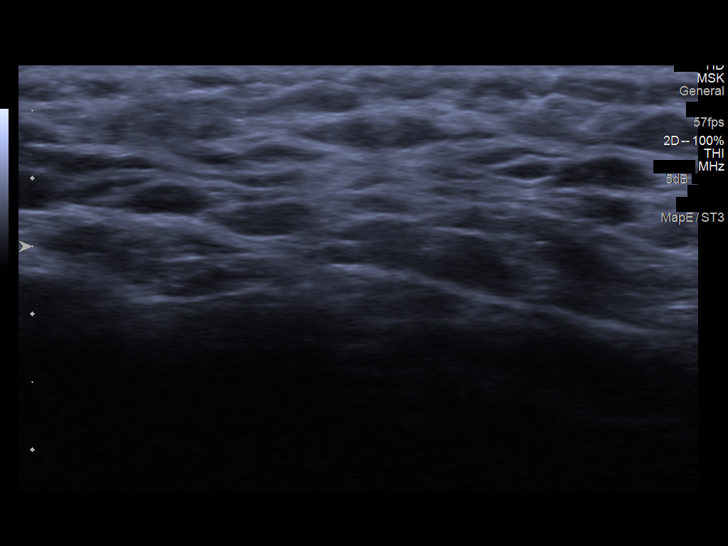
[im 7/27]
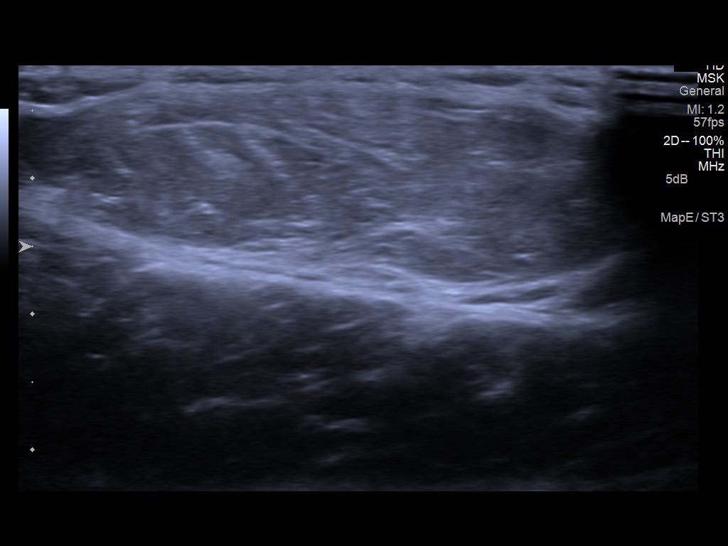
[im 9/27]
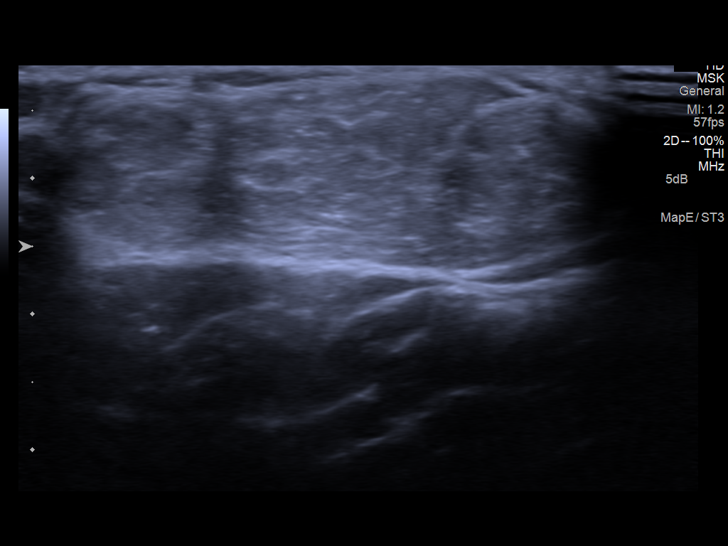
[im 11/27]
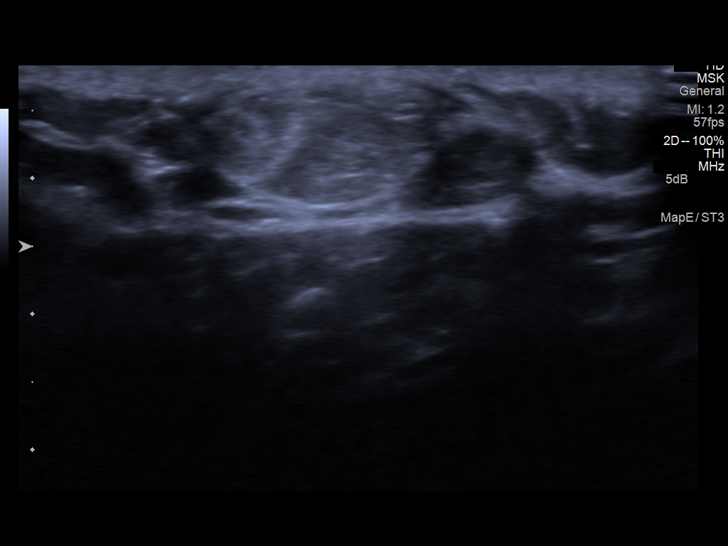
[im 13/27]
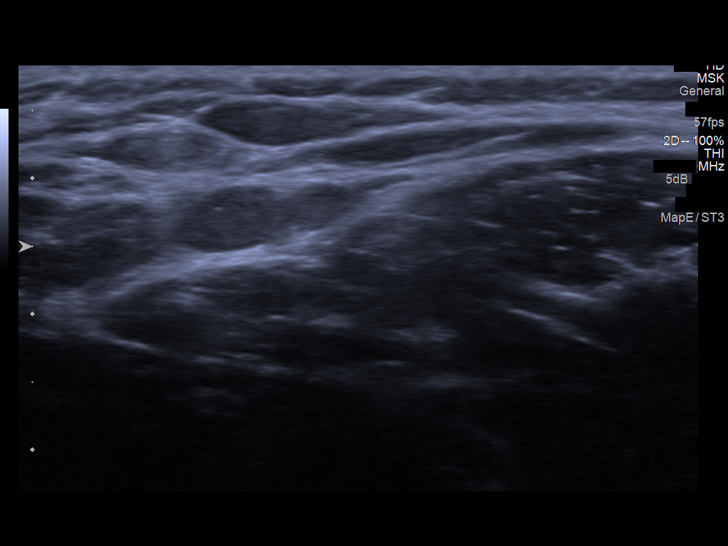
[im 14/27]
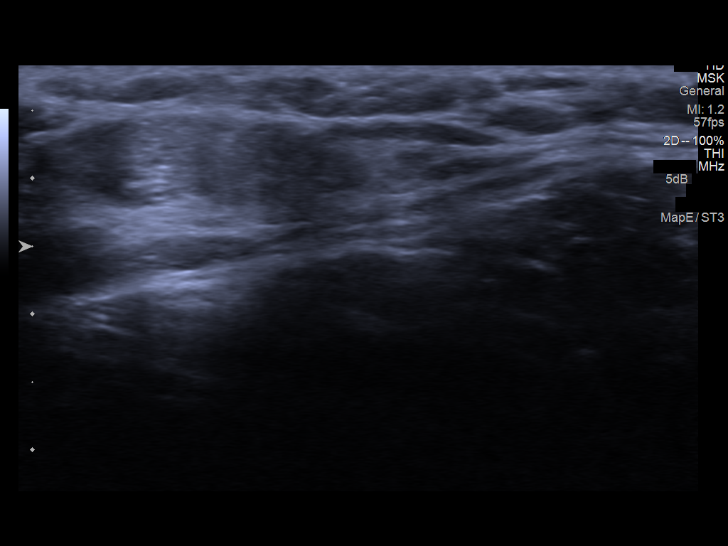
[im 16/27]
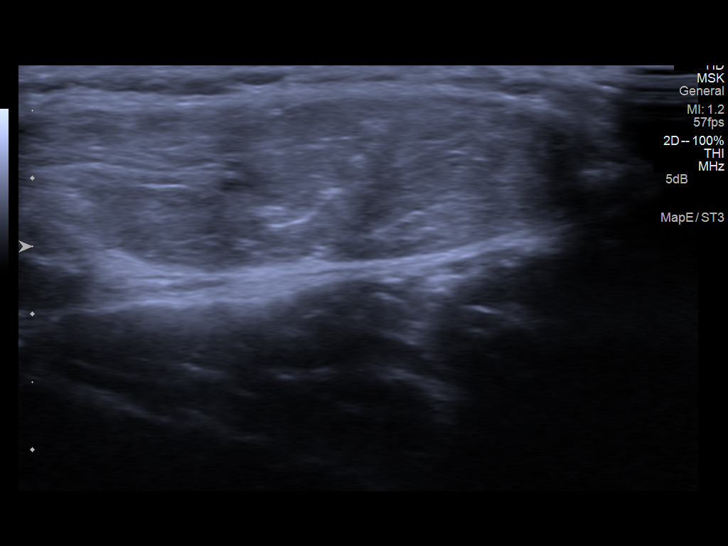
[im 18/27]
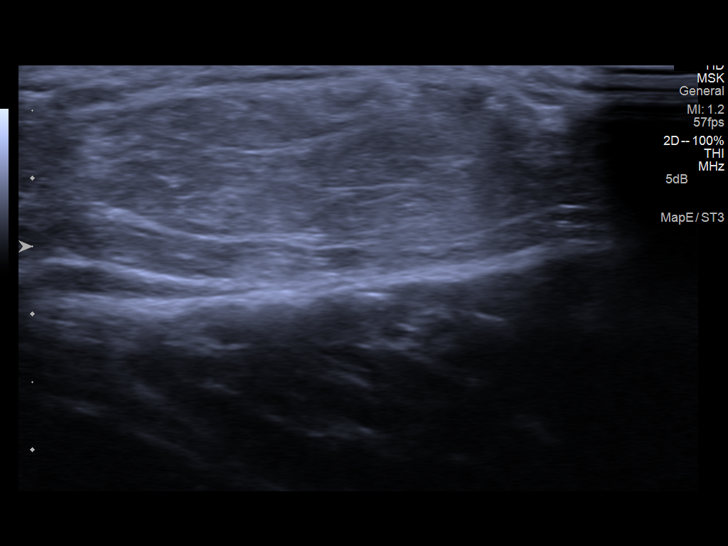
[im 21/27]
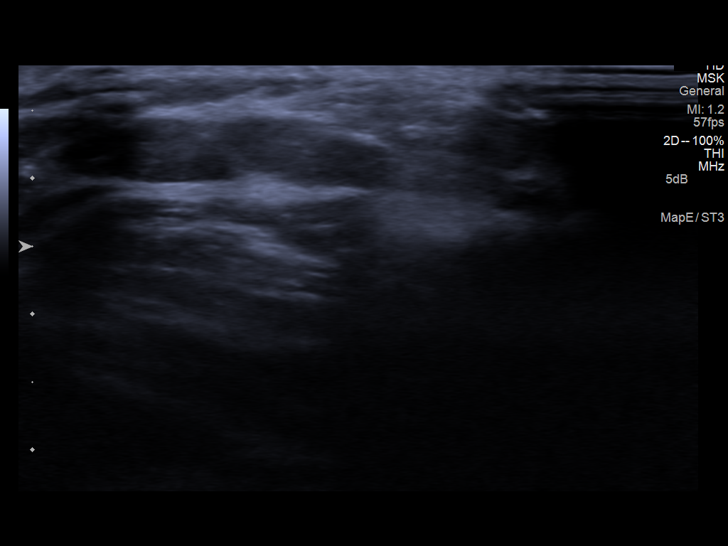
[im 23/27]
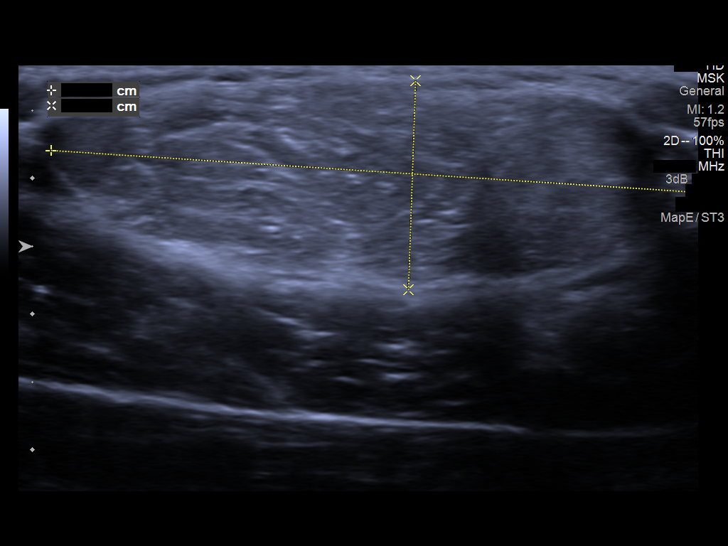
[im 25/27]
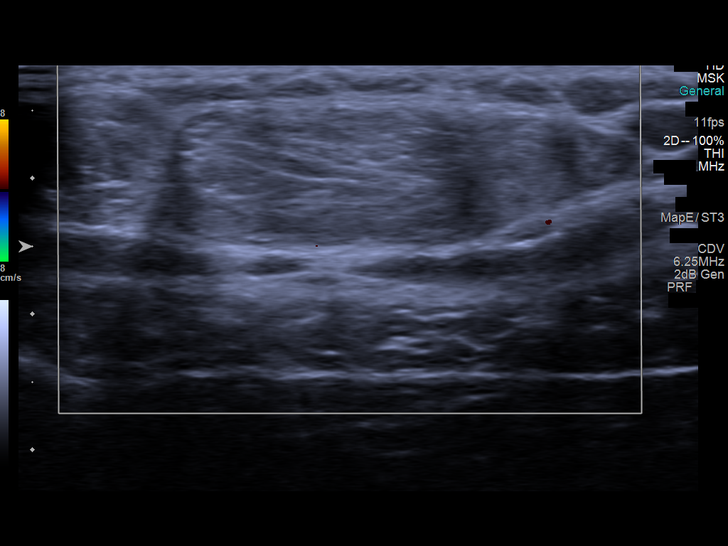
[im 27/27]
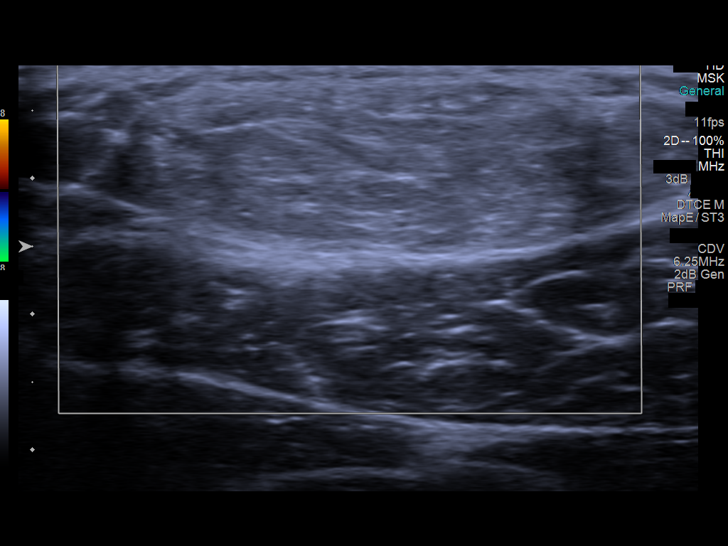

[14 of 16 positions shown; findings below may reference images not displayed]

FINDINGS: At the site of clinical concern, a hypoechoic heterogeneous nodule
is identified measuring the 4.8 x 1.5 x 2.9 cm.
Mass has sonographic characteristics most typical of fat and is
most consistent with a subcutaneous lipoma.
Mass shows no significant vascularity on color Doppler imaging.
No calcifications or cystic foci identified.
IMPRESSION: 4.8 x 1.5 x 2.9 cm diameter heterogeneous subcutaneous mass at site
of palpable concern at the left shoulder region most consistent
with a subcutaneous lipoma.

## 2015-08-23 MED FILL — METHYLPHENIDATE ER 54 MG TA: 54 | 30 days supply | Qty: 30 | Fill #0

## 2015-08-28 DIAGNOSIS — F1920 Other psychoactive substance dependence, uncomplicated: Secondary | ICD-10-CM | POA: Diagnosis not present

## 2015-08-28 DIAGNOSIS — Z79899 Other long term (current) drug therapy: Secondary | ICD-10-CM | POA: Diagnosis not present

## 2015-08-28 DIAGNOSIS — F902 Attention-deficit hyperactivity disorder, combined type: Secondary | ICD-10-CM | POA: Diagnosis not present

## 2015-09-06 MED FILL — DAYTRANA 30 MG/9 HOUR PATCH: 30 | 30 days supply | Qty: 30 | Fill #0

## 2015-09-26 MED FILL — METHYLPHENIDATE ER 54 MG TA: 54 | 30 days supply | Qty: 30 | Fill #0

## 2015-10-29 MED FILL — METHYLPHENIDATE ER 54 MG TA: 54 | 30 days supply | Qty: 30 | Fill #0

## 2015-11-27 MED FILL — DAYTRANA 30 MG/9 HOUR PATCH: 30 | 30 days supply | Qty: 30 | Fill #0

## 2016-01-09 MED FILL — DAYTRANA 30 MG/9 HOUR PATCH: 30 | 30 days supply | Qty: 30 | Fill #0

## 2016-01-22 DIAGNOSIS — F1920 Other psychoactive substance dependence, uncomplicated: Secondary | ICD-10-CM | POA: Diagnosis not present

## 2016-01-22 DIAGNOSIS — F902 Attention-deficit hyperactivity disorder, combined type: Secondary | ICD-10-CM | POA: Diagnosis not present

## 2016-01-22 DIAGNOSIS — Z79899 Other long term (current) drug therapy: Secondary | ICD-10-CM | POA: Diagnosis not present

## 2016-02-08 MED FILL — DAYTRANA 30 MG/9 HOUR PATCH: 30 | 30 days supply | Qty: 30 | Fill #0

## 2016-03-13 MED FILL — DAYTRANA 30 MG/9 HOUR PATCH: 30 | 30 days supply | Qty: 30 | Fill #0

## 2016-04-14 MED FILL — DAYTRANA 30 MG/9 HOUR PATCH: 30 | 30 days supply | Qty: 30 | Fill #0

## 2016-05-16 MED FILL — DAYTRANA 30 MG/9 HOUR PATCH: 30 | 30 days supply | Qty: 30 | Fill #0

## 2016-06-18 DIAGNOSIS — M66872 Spontaneous rupture of other tendons, left ankle and foot: Secondary | ICD-10-CM | POA: Diagnosis not present

## 2016-06-18 DIAGNOSIS — D684 Acquired coagulation factor deficiency: Secondary | ICD-10-CM | POA: Diagnosis not present

## 2016-06-18 DIAGNOSIS — Z6830 Body mass index (BMI) 30.0-30.9, adult: Secondary | ICD-10-CM | POA: Diagnosis not present

## 2016-06-18 DIAGNOSIS — R079 Chest pain, unspecified: Secondary | ICD-10-CM | POA: Diagnosis not present

## 2016-06-18 DIAGNOSIS — F909 Attention-deficit hyperactivity disorder, unspecified type: Secondary | ICD-10-CM | POA: Diagnosis not present

## 2016-06-26 MED FILL — DAYTRANA 30 MG/9 HOUR PATCH: 30 | 30 days supply | Qty: 30 | Fill #0

## 2016-07-09 DIAGNOSIS — F902 Attention-deficit hyperactivity disorder, combined type: Secondary | ICD-10-CM | POA: Diagnosis not present

## 2016-07-09 DIAGNOSIS — Z79899 Other long term (current) drug therapy: Secondary | ICD-10-CM | POA: Diagnosis not present

## 2016-07-09 MED FILL — MYDAYIS ER 50 MG CAPSULE: 50 | 7 days supply | Qty: 7 | Fill #0

## 2016-07-14 DIAGNOSIS — Z0000 Encounter for general adult medical examination without abnormal findings: Secondary | ICD-10-CM | POA: Diagnosis not present

## 2016-07-29 DIAGNOSIS — E785 Hyperlipidemia, unspecified: Secondary | ICD-10-CM | POA: Diagnosis not present

## 2016-07-29 DIAGNOSIS — Z0000 Encounter for general adult medical examination without abnormal findings: Secondary | ICD-10-CM | POA: Diagnosis not present

## 2016-07-29 DIAGNOSIS — R079 Chest pain, unspecified: Secondary | ICD-10-CM | POA: Diagnosis not present

## 2016-07-29 DIAGNOSIS — F901 Attention-deficit hyperactivity disorder, predominantly hyperactive type: Secondary | ICD-10-CM | POA: Diagnosis not present

## 2016-07-29 MED FILL — MYDAYIS ER 50 MG CAPSULE: 50 | 30 days supply | Qty: 30 | Fill #0

## 2016-08-29 MED FILL — MYDAYIS ER 50 MG CAPSULE: 50 | 30 days supply | Qty: 30 | Fill #0

## 2016-10-02 MED FILL — MYDAYIS ER 50 MG CAPSULE: 50 | 30 days supply | Qty: 30 | Fill #0

## 2016-11-06 MED FILL — MYDAYIS ER 50 MG CAPSULE: 50 | 30 days supply | Qty: 30 | Fill #0

## 2016-12-05 MED FILL — MYDAYIS ER 50 MG CAPSULE: 50 | 30 days supply | Qty: 30 | Fill #0

## 2016-12-24 DIAGNOSIS — F902 Attention-deficit hyperactivity disorder, combined type: Secondary | ICD-10-CM | POA: Diagnosis not present

## 2016-12-24 DIAGNOSIS — Z79899 Other long term (current) drug therapy: Secondary | ICD-10-CM | POA: Diagnosis not present

## 2016-12-24 DIAGNOSIS — F1920 Other psychoactive substance dependence, uncomplicated: Secondary | ICD-10-CM | POA: Diagnosis not present

## 2017-01-02 MED FILL — MYDAYIS ER 50 MG CAPSULE: 50 | 30 days supply | Qty: 30 | Fill #0

## 2017-02-04 MED FILL — MYDAYIS ER 50 MG CAPSULE: 50 | 30 days supply | Qty: 30 | Fill #0

## 2017-03-11 MED FILL — MYDAYIS ER 50 MG CAPSULE: 50 | 30 days supply | Qty: 30 | Fill #0

## 2017-03-24 DIAGNOSIS — Z79899 Other long term (current) drug therapy: Secondary | ICD-10-CM | POA: Diagnosis not present

## 2017-03-24 DIAGNOSIS — F902 Attention-deficit hyperactivity disorder, combined type: Secondary | ICD-10-CM | POA: Diagnosis not present

## 2017-04-09 MED FILL — MYDAYIS ER 50 MG CAPSULE: 50 | 30 days supply | Qty: 30 | Fill #0

## 2017-04-23 DIAGNOSIS — D485 Neoplasm of uncertain behavior of skin: Secondary | ICD-10-CM | POA: Diagnosis not present

## 2017-04-23 DIAGNOSIS — L821 Other seborrheic keratosis: Secondary | ICD-10-CM | POA: Diagnosis not present

## 2017-04-23 DIAGNOSIS — D2262 Melanocytic nevi of left upper limb, including shoulder: Secondary | ICD-10-CM | POA: Diagnosis not present

## 2017-04-23 DIAGNOSIS — D224 Melanocytic nevi of scalp and neck: Secondary | ICD-10-CM | POA: Diagnosis not present

## 2017-04-23 DIAGNOSIS — D2261 Melanocytic nevi of right upper limb, including shoulder: Secondary | ICD-10-CM | POA: Diagnosis not present

## 2017-04-23 DIAGNOSIS — D225 Melanocytic nevi of trunk: Secondary | ICD-10-CM | POA: Diagnosis not present

## 2017-04-23 DIAGNOSIS — L814 Other melanin hyperpigmentation: Secondary | ICD-10-CM | POA: Diagnosis not present

## 2017-04-23 DIAGNOSIS — D1801 Hemangioma of skin and subcutaneous tissue: Secondary | ICD-10-CM | POA: Diagnosis not present

## 2017-04-23 DIAGNOSIS — D235 Other benign neoplasm of skin of trunk: Secondary | ICD-10-CM | POA: Diagnosis not present

## 2017-05-13 MED FILL — MYDAYIS ER 50 MG CAPSULE: 50 | 30 days supply | Qty: 30 | Fill #0

## 2017-05-19 DIAGNOSIS — L988 Other specified disorders of the skin and subcutaneous tissue: Secondary | ICD-10-CM | POA: Diagnosis not present

## 2017-05-19 DIAGNOSIS — D485 Neoplasm of uncertain behavior of skin: Secondary | ICD-10-CM | POA: Diagnosis not present

## 2017-05-19 MED FILL — MUPIROCIN 2% OINTMENT: 2 | 20 days supply | Qty: 22 | Fill #0

## 2017-06-11 MED FILL — MYDAYIS ER 50 MG CAPSULE: 50 | 30 days supply | Qty: 30 | Fill #0

## 2017-06-24 DIAGNOSIS — Z79899 Other long term (current) drug therapy: Secondary | ICD-10-CM | POA: Diagnosis not present

## 2017-06-24 DIAGNOSIS — F902 Attention-deficit hyperactivity disorder, combined type: Secondary | ICD-10-CM | POA: Diagnosis not present

## 2017-07-14 MED FILL — MYDAYIS ER 50 MG CAPSULE: 50 | 30 days supply | Qty: 30 | Fill #0

## 2017-08-09 DIAGNOSIS — H9203 Otalgia, bilateral: Secondary | ICD-10-CM | POA: Diagnosis not present

## 2017-08-09 DIAGNOSIS — Z6832 Body mass index (BMI) 32.0-32.9, adult: Secondary | ICD-10-CM | POA: Diagnosis not present

## 2017-08-09 DIAGNOSIS — J069 Acute upper respiratory infection, unspecified: Secondary | ICD-10-CM | POA: Diagnosis not present

## 2017-08-09 DIAGNOSIS — F909 Attention-deficit hyperactivity disorder, unspecified type: Secondary | ICD-10-CM | POA: Diagnosis not present

## 2017-08-09 DIAGNOSIS — J029 Acute pharyngitis, unspecified: Secondary | ICD-10-CM | POA: Diagnosis not present

## 2017-08-09 DIAGNOSIS — M66872 Spontaneous rupture of other tendons, left ankle and foot: Secondary | ICD-10-CM | POA: Diagnosis not present

## 2017-08-17 MED FILL — MYDAYIS ER 50 MG CAPSULE: 50 | 30 days supply | Qty: 30 | Fill #0

## 2017-09-14 MED FILL — MYDAYIS ER 50 MG CAPSULE: 50 | 30 days supply | Qty: 30 | Fill #0

## 2017-09-24 DIAGNOSIS — F902 Attention-deficit hyperactivity disorder, combined type: Secondary | ICD-10-CM | POA: Diagnosis not present

## 2017-09-24 DIAGNOSIS — Z79899 Other long term (current) drug therapy: Secondary | ICD-10-CM | POA: Diagnosis not present

## 2017-10-14 MED FILL — MYDAYIS ER 50 MG CAPSULE: 50 | 30 days supply | Qty: 30 | Fill #0

## 2017-11-13 MED FILL — MYDAYIS ER 50 MG CAPSULE: 50 | 30 days supply | Qty: 30 | Fill #0

## 2017-12-10 DIAGNOSIS — F909 Attention-deficit hyperactivity disorder, unspecified type: Secondary | ICD-10-CM | POA: Diagnosis not present

## 2017-12-10 DIAGNOSIS — Z0000 Encounter for general adult medical examination without abnormal findings: Secondary | ICD-10-CM | POA: Diagnosis not present

## 2017-12-10 DIAGNOSIS — Z6833 Body mass index (BMI) 33.0-33.9, adult: Secondary | ICD-10-CM | POA: Diagnosis not present

## 2017-12-10 DIAGNOSIS — L309 Dermatitis, unspecified: Secondary | ICD-10-CM | POA: Diagnosis not present

## 2017-12-10 DIAGNOSIS — L989 Disorder of the skin and subcutaneous tissue, unspecified: Secondary | ICD-10-CM | POA: Diagnosis not present

## 2017-12-11 MED FILL — MYDAYIS ER 50 MG CAPSULE: 50 | 30 days supply | Qty: 30 | Fill #0

## 2017-12-17 MED FILL — FLUTICASONE PROP 0.05% CRM: 0.05 | 7 days supply | Qty: 30 | Fill #0

## 2017-12-31 DIAGNOSIS — Z79899 Other long term (current) drug therapy: Secondary | ICD-10-CM | POA: Diagnosis not present

## 2017-12-31 DIAGNOSIS — F902 Attention-deficit hyperactivity disorder, combined type: Secondary | ICD-10-CM | POA: Diagnosis not present

## 2018-01-08 MED FILL — MYDAYIS ER 50 MG CAPSULE: 50 | 30 days supply | Qty: 30 | Fill #0

## 2018-02-12 MED FILL — MYDAYIS ER 50 MG CAPSULE: 50 | 30 days supply | Qty: 30 | Fill #0

## 2018-03-17 MED FILL — MYDAYIS ER 50 MG CAPSULE: 50 | 30 days supply | Qty: 30 | Fill #0

## 2018-03-29 DIAGNOSIS — F902 Attention-deficit hyperactivity disorder, combined type: Secondary | ICD-10-CM | POA: Diagnosis not present

## 2018-03-29 DIAGNOSIS — Z79899 Other long term (current) drug therapy: Secondary | ICD-10-CM | POA: Diagnosis not present

## 2018-04-15 MED FILL — MYDAYIS ER 50 MG CAPSULE: 50 | 30 days supply | Qty: 30 | Fill #0

## 2018-05-13 MED FILL — MYDAYIS ER 50 MG CAPSULE: 50 | 30 days supply | Qty: 30 | Fill #0

## 2018-06-14 MED FILL — MYDAYIS ER 50 MG CAPSULE: 50 | 30 days supply | Qty: 30 | Fill #0

## 2018-06-28 DIAGNOSIS — F902 Attention-deficit hyperactivity disorder, combined type: Secondary | ICD-10-CM | POA: Diagnosis not present

## 2018-06-28 DIAGNOSIS — Z79899 Other long term (current) drug therapy: Secondary | ICD-10-CM | POA: Diagnosis not present

## 2018-07-20 MED FILL — MYDAYIS ER 50 MG CAPSULE: 50 | 30 days supply | Qty: 30 | Fill #0

## 2018-08-20 MED FILL — MYDAYIS ER 50 MG CAPSULE: 50 | 30 days supply | Qty: 30 | Fill #0

## 2018-09-20 MED FILL — MYDAYIS ER 50 MG CAPSULE: 50 | 30 days supply | Qty: 30 | Fill #0

## 2018-10-19 DIAGNOSIS — Z79899 Other long term (current) drug therapy: Secondary | ICD-10-CM | POA: Diagnosis not present

## 2018-10-19 DIAGNOSIS — F902 Attention-deficit hyperactivity disorder, combined type: Secondary | ICD-10-CM | POA: Diagnosis not present

## 2018-10-21 MED FILL — MYDAYIS ER 50 MG CAPSULE: 50 | 30 days supply | Qty: 30 | Fill #0

## 2018-11-22 MED FILL — MYDAYIS ER 50 MG CAPSULE: 50 | 30 days supply | Qty: 30 | Fill #0

## 2018-12-21 DIAGNOSIS — Z20828 Contact with and (suspected) exposure to other viral communicable diseases: Secondary | ICD-10-CM | POA: Diagnosis not present

## 2018-12-22 MED FILL — MYDAYIS ER 50 MG CAPSULE: 50 | 30 days supply | Qty: 30 | Fill #0

## 2019-01-14 DIAGNOSIS — F902 Attention-deficit hyperactivity disorder, combined type: Secondary | ICD-10-CM | POA: Diagnosis not present

## 2019-01-24 MED FILL — MYDAYIS ER 50 MG CAPSULE: 50 | 30 days supply | Qty: 30 | Fill #0

## 2019-02-24 MED FILL — MYDAYIS ER 50 MG CAPSULE: 50 | 30 days supply | Qty: 30 | Fill #0

## 2019-03-25 MED FILL — MYDAYIS ER 50 MG CAPSULE: 50 | 30 days supply | Qty: 30 | Fill #0

## 2019-04-26 MED FILL — MYDAYIS ER 50 MG CAPSULE: 50 | 30 days supply | Qty: 30 | Fill #0

## 2019-05-18 DIAGNOSIS — Z79899 Other long term (current) drug therapy: Secondary | ICD-10-CM | POA: Diagnosis not present

## 2019-05-18 DIAGNOSIS — F902 Attention-deficit hyperactivity disorder, combined type: Secondary | ICD-10-CM | POA: Diagnosis not present

## 2019-05-23 MED FILL — MYDAYIS ER 50 MG CAPSULE: 50 | 30 days supply | Qty: 30 | Fill #0

## 2019-06-13 ENCOUNTER — Ambulatory Visit (HOSPITAL_COMMUNITY)
Admission: EM | Admit: 2019-06-13 | Discharge: 2019-06-13 | Disposition: A | Discharge status: Home / Self Care | Payer: 59 | Attending: Family Medicine | Admitting: Family Medicine

## 2019-06-13 ENCOUNTER — Encounter (HOSPITAL_COMMUNITY): Payer: Self-pay

## 2019-06-13 ENCOUNTER — Other Ambulatory Visit: Payer: Self-pay

## 2019-06-13 DIAGNOSIS — N201 Calculus of ureter: Secondary | ICD-10-CM

## 2019-06-13 LAB — POCT URINALYSIS DIP (DEVICE)
Bilirubin Urine: NEGATIVE
Glucose, UA: NEGATIVE mg/dL
Ketones, ur: NEGATIVE mg/dL
Leukocytes,Ua: NEGATIVE
Nitrite: NEGATIVE
Protein, ur: NEGATIVE mg/dL
Specific Gravity, Urine: >=1.03 (ref 1.005–1.030)
Urobilinogen, UA: 0.2 mg/dL (ref 0.0–1.0)
pH: 6 (ref 5.0–8.0)

## 2019-06-13 MED ORDER — KETOROLAC TROMETHAMINE 60 MG/2ML IM SOLN
60.0000 mg | Freq: Once | INTRAMUSCULAR | Status: AC
  Administered 2019-06-13: 60 mg via INTRAMUSCULAR

## 2019-06-13 MED ORDER — TAMSULOSIN HCL 0.4 MG PO CAPS: 0.4000 mg | ORAL_CAPSULE | Freq: Every day | ORAL | 0 refills | Status: DC

## 2019-06-13 MED ORDER — KETOROLAC TROMETHAMINE 60 MG/2ML IM SOLN
INTRAMUSCULAR | Status: AC
  Filled 2019-06-13: qty 2

## 2019-06-13 NOTE — Discharge Instructions (Addendum)
This has all the features of a kidney stone.  Hopefully it will pass over night.  If pain worsens or persists, go to [facility].  Call Dr. Diona Fanti for follow up care.

## 2019-06-13 NOTE — ED Triage Notes (Signed)
Patient presents to Urgent Care with complaints of left flank pain and urinary hesitancy since 2pm today. Patient reports he took iburpfen 30 mins pta.

## 2019-06-13 NOTE — ED Provider Notes (Signed)
[facility] [place] [place]    CSN: [id number] Arrival date & time: 06/13/19  1955      History   Chief Complaint Chief Complaint  Patient presents with  . Flank Pain    HPI Tom Wilson is a 48 y.o. male.   Initial [facility] patient visit for this 48 yo man complaining of flank pain with difficulty urinating.  Left flank pain began around 2 and was lessened after 400 mg of ibuprofen but the pain returned and he then took 800 mg ibuprofen but the pain has persisted.  Patient has no hematuria, no h/o kidney stones, no dysuria, no fever.  Patient describes pain as flank pain radiating into left groin and, at times, left testicle.     Past Medical History:  Diagnosis Date  . ADD (attention deficit disorder)     Patient Active Problem List   Diagnosis Date Noted  . Lipoma of back - 5 cm 03/11/2013    Past Surgical History:  Procedure Laterality Date  . ACHILLES TENDON SURGERY Left 11/03/2013   Procedure: LEFT ACHILLES TENDON REPAIR;  Surgeon: Wylene Simmer, MD;  Location: [place];  Service: Orthopedics;  Laterality: Left;  . COLONOSCOPY    . LIPOMA EXCISION    . WISDOM TOOTH EXTRACTION         Home Medications    Prior to Admission medications   Medication Sig Start Date End Date Taking? Authorizing Provider  HYDROcodone-acetaminophen (NORCO) 5-325 MG per tablet Take 1-2 tablets by mouth every 6 (six) hours as needed. 11/03/13   Wylene Simmer, MD  methylphenidate (CONCERTA) 27 MG CR tablet Take 27 mg by mouth every morning.    [provider]  methylphenidate [facility]) 15 mg/9hr Place 15 mg onto the skin daily. wear patch for 9 hours only each day    [provider]    Family History Family History  Problem Relation Age of Onset  . Multiple sclerosis Mother   . Cancer Father        lung cancer  . Heart failure Father     Social History Social History   Tobacco Use  . Smoking status: Never Smoker  . Smokeless tobacco: Never Used   Substance Use Topics  . Alcohol use: No  . Drug use: No     Allergies   Patient has no known allergies.   Review of Systems Review of Systems  Genitourinary: Positive for flank pain.  All other systems reviewed and are negative.    Physical Exam Triage Vital Signs ED Triage Vitals  Enc Vitals Group     BP      Pulse      Resp      Temp      Temp src      SpO2      Weight      Height      Head Circumference      Peak Flow      Pain Score      Pain Loc      Pain Edu?      Excl. in [place]?    No data found.  Updated Vital Signs There were no vitals taken for this visit.   Physical Exam Vitals signs and nursing note reviewed.  Constitutional:      General: He is not in acute distress.    Appearance: Normal appearance. He is normal weight. He is not ill-appearing or toxic-appearing.  HENT:     Head: Normocephalic.  Nose: Nose normal.  Eyes:     Conjunctiva/sclera: Conjunctivae normal.  Neck:     Musculoskeletal: Normal range of motion and neck supple.  Cardiovascular:     Rate and Rhythm: Normal rate.  Pulmonary:     Effort: Pulmonary effort is normal.  Abdominal:     Palpations: Abdomen is soft.     Comments: Mildly tender left flank  Musculoskeletal: Normal range of motion.  Skin:    General: Skin is warm and dry.  Neurological:     General: No focal deficit present.     Mental Status: He is alert.  Psychiatric:        Mood and Affect: Mood normal.        Behavior: Behavior normal.        Thought Content: Thought content normal.        Judgment: Judgment normal.      UC Treatments / Results  Labs (all labs ordered are listed, but only abnormal results are displayed) Moderate blood on UA  Medications - No data to display  Initial Impression / Assessment and Plan / UC Course  I have reviewed the triage vital signs and the nursing notes.  Pertinent labs & imaging results that were available during my care of the patient were reviewed by  me and considered in my medical decision making (see chart for details).    Final Clinical Impressions(s) / UC Diagnoses   Final diagnoses:  None   Discharge Instructions   None    ED Prescriptions    None     I have reviewed the PDMP during this encounter.   Robyn Haber, MD 06/13/19 2029

## 2019-06-21 DIAGNOSIS — N201 Calculus of ureter: Secondary | ICD-10-CM | POA: Diagnosis not present

## 2019-06-21 DIAGNOSIS — R1084 Generalized abdominal pain: Secondary | ICD-10-CM | POA: Diagnosis not present

## 2019-06-21 DIAGNOSIS — R311 Benign essential microscopic hematuria: Secondary | ICD-10-CM | POA: Diagnosis not present

## 2019-06-21 DIAGNOSIS — N132 Hydronephrosis with renal and ureteral calculous obstruction: Secondary | ICD-10-CM | POA: Diagnosis not present

## 2019-06-21 MED FILL — TAMSULOSIN HCL 0.4 MG CAP: 0.4 | 30 days supply | Qty: 30 | Fill #0

## 2019-06-24 MED FILL — MYDAYIS ER 50 MG CAPSULE: 50 | 30 days supply | Qty: 30 | Fill #0

## 2019-07-13 DIAGNOSIS — N201 Calculus of ureter: Secondary | ICD-10-CM | POA: Diagnosis not present

## 2019-07-22 MED FILL — MYDAYIS ER 50 MG CAPSULE: 50 | 30 days supply | Qty: 30 | Fill #0

## 2019-08-24 DIAGNOSIS — F902 Attention-deficit hyperactivity disorder, combined type: Secondary | ICD-10-CM | POA: Diagnosis not present

## 2019-08-24 DIAGNOSIS — Z79899 Other long term (current) drug therapy: Secondary | ICD-10-CM | POA: Diagnosis not present

## 2019-08-24 MED FILL — MYDAYIS ER 50 MG CAPSULE: 50 | 30 days supply | Qty: 30 | Fill #0

## 2019-09-23 MED FILL — MYDAYIS ER 50 MG CAPSULE: 50 | 30 days supply | Qty: 30 | Fill #0

## 2019-10-21 DIAGNOSIS — U071 COVID-19: Secondary | ICD-10-CM | POA: Diagnosis not present

## 2019-10-21 DIAGNOSIS — H9209 Otalgia, unspecified ear: Secondary | ICD-10-CM | POA: Diagnosis not present

## 2019-10-24 MED FILL — MYDAYIS ER 50 MG CAPSULE: 50 | 30 days supply | Qty: 30 | Fill #0

## 2019-10-27 ENCOUNTER — Encounter (HOSPITAL_COMMUNITY): Payer: Self-pay

## 2019-10-27 ENCOUNTER — Emergency Department (HOSPITAL_COMMUNITY): Payer: 59

## 2019-10-27 ENCOUNTER — Inpatient Hospital Stay (HOSPITAL_COMMUNITY)
Admission: EM | Admit: 2019-10-27 | Discharge: 2019-10-31 | DRG: 177 | Disposition: A | Discharge status: Home / Self Care | Payer: 59 | Attending: Internal Medicine | Admitting: Internal Medicine

## 2019-10-27 ENCOUNTER — Other Ambulatory Visit: Payer: Self-pay

## 2019-10-27 DIAGNOSIS — U071 COVID-19: Secondary | ICD-10-CM | POA: Diagnosis not present

## 2019-10-27 DIAGNOSIS — Z801 Family history of malignant neoplasm of trachea, bronchus and lung: Secondary | ICD-10-CM | POA: Diagnosis not present

## 2019-10-27 DIAGNOSIS — N400 Enlarged prostate without lower urinary tract symptoms: Secondary | ICD-10-CM | POA: Diagnosis present

## 2019-10-27 DIAGNOSIS — R0902 Hypoxemia: Secondary | ICD-10-CM | POA: Diagnosis not present

## 2019-10-27 DIAGNOSIS — F988 Other specified behavioral and emotional disorders with onset usually occurring in childhood and adolescence: Secondary | ICD-10-CM | POA: Diagnosis present

## 2019-10-27 DIAGNOSIS — Z820 Family history of epilepsy and other diseases of the nervous system: Secondary | ICD-10-CM

## 2019-10-27 DIAGNOSIS — J1282 Pneumonia due to coronavirus disease 2019: Secondary | ICD-10-CM | POA: Diagnosis not present

## 2019-10-27 DIAGNOSIS — J9601 Acute respiratory failure with hypoxia: Secondary | ICD-10-CM | POA: Diagnosis present

## 2019-10-27 DIAGNOSIS — Z8249 Family history of ischemic heart disease and other diseases of the circulatory system: Secondary | ICD-10-CM

## 2019-10-27 DIAGNOSIS — Z79899 Other long term (current) drug therapy: Secondary | ICD-10-CM | POA: Diagnosis not present

## 2019-10-27 DIAGNOSIS — R7401 Elevation of levels of liver transaminase levels: Secondary | ICD-10-CM | POA: Diagnosis present

## 2019-10-27 DIAGNOSIS — R0602 Shortness of breath: Secondary | ICD-10-CM | POA: Diagnosis not present

## 2019-10-27 LAB — CBC WITH DIFFERENTIAL/PLATELET
Abs Immature Granulocytes: 0.08 10*3/uL — ABNORMAL HIGH (ref 0.00–0.07)
Basophils Absolute: 0 10*3/uL (ref 0.0–0.1)
Basophils Relative: 0 %
Eosinophils Absolute: 0 10*3/uL (ref 0.0–0.5)
Eosinophils Relative: 0 %
HCT: 40.5 % (ref 39.0–52.0)
Hemoglobin: 13.5 g/dL (ref 13.0–17.0)
Immature Granulocytes: 1 %
Lymphocytes Relative: 8 %
Lymphs Abs: 0.9 10*3/uL (ref 0.7–4.0)
MCH: 28.5 pg (ref 26.0–34.0)
MCHC: 33.3 g/dL (ref 30.0–36.0)
MCV: 85.6 fL (ref 80.0–100.0)
Monocytes Absolute: 0.5 10*3/uL (ref 0.1–1.0)
Monocytes Relative: 4 %
Neutro Abs: 9.7 10*3/uL — ABNORMAL HIGH (ref 1.7–7.7)
Neutrophils Relative %: 87 %
Platelets: 271 10*3/uL (ref 150–400)
RBC: 4.73 MIL/uL (ref 4.22–5.81)
RDW: 14.4 % (ref 11.5–15.5)
WBC: 11.2 10*3/uL — ABNORMAL HIGH (ref 4.0–10.5)
nRBC: 0 % (ref 0.0–0.2)

## 2019-10-27 LAB — PROCALCITONIN: Procalcitonin: 0.6 ng/mL

## 2019-10-27 LAB — C-REACTIVE PROTEIN: CRP: 30.6 mg/dL — ABNORMAL HIGH (ref <1.0)

## 2019-10-27 LAB — COMPREHENSIVE METABOLIC PANEL
ALT: 53 U/L — ABNORMAL HIGH (ref 0–44)
AST: 52 U/L — ABNORMAL HIGH (ref 15–41)
Albumin: 3.3 g/dL — ABNORMAL LOW (ref 3.5–5.0)
Alkaline Phosphatase: 68 U/L (ref 38–126)
Anion gap: 10 (ref 5–15)
BUN: 23 mg/dL — ABNORMAL HIGH (ref 6–20)
CO2: 25 mmol/L (ref 22–32)
Calcium: 8.4 mg/dL — ABNORMAL LOW (ref 8.9–10.3)
Chloride: 102 mmol/L (ref 98–111)
Creatinine, Ser: 1.39 mg/dL — ABNORMAL HIGH (ref 0.61–1.24)
GFR calc Af Amer: >60 mL/min (ref >60)
GFR calc non Af Amer: 59 mL/min — ABNORMAL LOW (ref >60)
Glucose, Bld: 121 mg/dL — ABNORMAL HIGH (ref 70–99)
Potassium: 3.6 mmol/L (ref 3.5–5.1)
Sodium: 137 mmol/L (ref 135–145)
Total Bilirubin: 0.9 mg/dL (ref 0.3–1.2)
Total Protein: 6.9 g/dL (ref 6.5–8.1)

## 2019-10-27 LAB — LACTIC ACID, PLASMA: Lactic Acid, Venous: 1.2 mmol/L (ref 0.5–1.9)

## 2019-10-27 LAB — FERRITIN: Ferritin: 2389 ng/mL — ABNORMAL HIGH (ref 24–336)

## 2019-10-27 LAB — TRIGLYCERIDES: Triglycerides: 92 mg/dL (ref <150)

## 2019-10-27 LAB — RESPIRATORY PANEL BY RT PCR (FLU A&B, COVID)
Influenza A by PCR: NEGATIVE
Influenza B by PCR: NEGATIVE
SARS Coronavirus 2 by RT PCR: POSITIVE — AB

## 2019-10-27 LAB — LACTATE DEHYDROGENASE: LDH: 398 U/L — ABNORMAL HIGH (ref 98–192)

## 2019-10-27 LAB — POC SARS CORONAVIRUS 2 AG -  ED: SARS Coronavirus 2 Ag: NEGATIVE

## 2019-10-27 LAB — FIBRINOGEN: Fibrinogen: >800 mg/dL — ABNORMAL HIGH (ref 210–475)

## 2019-10-27 LAB — D-DIMER, QUANTITATIVE: D-Dimer, Quant: 2.35 ug/mL-FEU — ABNORMAL HIGH (ref 0.00–0.50)

## 2019-10-27 MED ORDER — ASCORBIC ACID 500 MG PO TABS
500.0000 mg | ORAL_TABLET | Freq: Every day | ORAL | Status: DC
  Administered 2019-10-27 – 2019-10-31 (×5): 500 mg via ORAL
  Filled 2019-10-27 (×5): qty 1

## 2019-10-27 MED ORDER — TAMSULOSIN HCL 0.4 MG PO CAPS
0.4000 mg | ORAL_CAPSULE | Freq: Every day | ORAL | Status: DC
  Administered 2019-10-27 – 2019-10-30 (×4): 0.4 mg via ORAL
  Filled 2019-10-27 (×4): qty 1

## 2019-10-27 MED ORDER — ENSURE ENLIVE PO LIQD
237.0000 mL | Freq: Three times a day (TID) | ORAL | Status: DC
  Administered 2019-10-27 – 2019-10-30 (×8): 237 mL via ORAL

## 2019-10-27 MED ORDER — ACETAMINOPHEN 325 MG PO TABS
650.0000 mg | ORAL_TABLET | Freq: Four times a day (QID) | ORAL | Status: DC | PRN
  Administered 2019-10-27 – 2019-10-28 (×2): 650 mg via ORAL
  Filled 2019-10-27 (×2): qty 2

## 2019-10-27 MED ORDER — DEXAMETHASONE 4 MG PO TABS
6.0000 mg | ORAL_TABLET | ORAL | Status: DC
  Administered 2019-10-28 – 2019-10-31 (×4): 6 mg via ORAL
  Filled 2019-10-27 (×5): qty 1

## 2019-10-27 MED ORDER — SODIUM CHLORIDE 0.9 % IV SOLN
100.0000 mg | INTRAVENOUS | Status: AC
  Administered 2019-10-27 (×2): 100 mg via INTRAVENOUS
  Filled 2019-10-27 (×2): qty 20

## 2019-10-27 MED ORDER — DEXAMETHASONE SODIUM PHOSPHATE 10 MG/ML IJ SOLN
6.0000 mg | Freq: Once | INTRAMUSCULAR | Status: AC
  Administered 2019-10-27: 6 mg via INTRAVENOUS
  Filled 2019-10-27: qty 1

## 2019-10-27 MED ORDER — ENOXAPARIN SODIUM 40 MG/0.4ML ~~LOC~~ SOLN
40.0000 mg | SUBCUTANEOUS | Status: DC
  Administered 2019-10-27: 40 mg via SUBCUTANEOUS
  Filled 2019-10-27: qty 0.4

## 2019-10-27 MED ORDER — ENSURE ENLIVE PO LIQD
237.0000 mL | Freq: Two times a day (BID) | ORAL | Status: DC
  Administered 2019-10-27: 237 mL via ORAL

## 2019-10-27 MED ORDER — METHYLPHENIDATE HCL ER (OSM) 27 MG PO TBCR: 27.0000 mg | EXTENDED_RELEASE_TABLET | ORAL | Status: DC

## 2019-10-27 MED ORDER — ZINC SULFATE 220 (50 ZN) MG PO CAPS
220.0000 mg | ORAL_CAPSULE | Freq: Every day | ORAL | Status: DC
  Administered 2019-10-27 – 2019-10-31 (×5): 220 mg via ORAL
  Filled 2019-10-27 (×5): qty 1

## 2019-10-27 MED ORDER — SODIUM CHLORIDE 0.9 % IV SOLN
100.0000 mg | Freq: Every day | INTRAVENOUS | Status: AC
  Administered 2019-10-28 – 2019-10-31 (×4): 100 mg via INTRAVENOUS
  Filled 2019-10-27 (×4): qty 20

## 2019-10-27 MED ORDER — GUAIFENESIN-DM 100-10 MG/5ML PO SYRP
10.0000 mL | ORAL_SOLUTION | ORAL | Status: DC | PRN
  Administered 2019-10-27: 10 mL via ORAL
  Filled 2019-10-27: qty 10

## 2019-10-27 MED ORDER — ONDANSETRON HCL 4 MG/2ML IJ SOLN: 4.0000 mg | Freq: Four times a day (QID) | INTRAMUSCULAR | Status: DC | PRN

## 2019-10-27 MED ORDER — ONDANSETRON HCL 4 MG PO TABS: 4.0000 mg | ORAL_TABLET | Freq: Four times a day (QID) | ORAL | Status: DC | PRN

## 2019-10-27 NOTE — ED Provider Notes (Signed)
[facility] Emergency Department Provider Note MRN:  [id number]  Arrival date & time: 10/27/19     Chief Complaint   Shortness of Breath   History of Present Illness   Tom Wilson is a 49 y.o. year-old male with no pertinent past medical history presenting to the ED with chief complaint of shortness of breath.  Patient was recently diagnosed with COVID-19.  Has been having cold or flulike symptoms for 11 days.  Mostly chills and body aches.  Worsening cough for the past few days.  Worsening shortness of breath yesterday and today.  Wife made him come to the emergency department tonight because of his breathing while he was trying to sleep.  Denies chest pain, no abdominal pain, no vomiting, no diarrhea.  Review of Systems  A complete 10 system review of systems was obtained and all systems are negative except as noted in the HPI and PMH.   Patient's Health History    Past Medical History:  Diagnosis Date  . ADD (attention deficit disorder)     Past Surgical History:  Procedure Laterality Date  . ACHILLES TENDON SURGERY Left 11/03/2013   Procedure: LEFT ACHILLES TENDON REPAIR;  Surgeon: Wylene Simmer, MD;  Location: [place];  Service: Orthopedics;  Laterality: Left;  . COLONOSCOPY    . LIPOMA EXCISION    . WISDOM TOOTH EXTRACTION      Family History  Problem Relation Age of Onset  . Multiple sclerosis Mother   . Cancer Father        lung cancer  . Heart failure Father     Social History   Socioeconomic History  . Marital status: Married    Spouse name: Not on file  . Number of children: Not on file  . Years of education: Not on file  . Highest education level: Not on file  Occupational History  . Not on file  Tobacco Use  . Smoking status: Never Smoker  . Smokeless tobacco: Never Used  Substance and Sexual Activity  . Alcohol use: No  . Drug use: No  . Sexual activity: Not on file  Other Topics Concern  . Not on file    Social History Narrative  . Not on file   Social Determinants of Health   Financial Resource Strain:   . Difficulty of Paying Living Expenses:   Food Insecurity:   . Worried About Charity fundraiser in the Last Year:   . Arboriculturist in the Last Year:   Transportation Needs:   . Film/video editor (Medical):   Marland Kitchen Lack of Transportation (Non-Medical):   Physical Activity:   . Days of Exercise per Week:   . Minutes of Exercise per Session:   Stress:   . Feeling of Stress :   Social Connections:   . Frequency of Communication with Friends and Family:   . Frequency of Social Gatherings with Friends and Family:   . Attends Religious Services:   . Active Member of Clubs or Organizations:   . Attends Archivist Meetings:   Marland Kitchen Marital Status:   Intimate Partner Violence:   . Fear of Current or Ex-Partner:   . Emotionally Abused:   Marland Kitchen Physically Abused:   . Sexually Abused:      Physical Exam   Vitals:   10/27/19 0529 10/27/19 0530  BP: 109/74 109/74  Pulse: 79 77  Resp: (!) 31 (!) 30  Temp:    SpO2: 96% 96%  CONSTITUTIONAL: Well-appearing, NAD, diaphoretic, looks tired NEURO:  Alert and oriented x 3, no focal deficits EYES:  eyes equal and reactive ENT/NECK:  no LAD, no JVD CARDIO: Regular rate, well-perfused, normal S1 and S2 PULM:  CTAB no wheezing or rhonchi GI/GU:  normal bowel sounds, non-distended, non-tender MSK/SPINE:  No gross deformities, no edema SKIN:  no rash, atraumatic PSYCH:  Appropriate speech and behavior  *Additional and/or pertinent findings included in MDM below  Diagnostic and Interventional Summary    EKG Interpretation  Date/Time:  Thursday October 27 2019 04:32:52 EDT Ventricular Rate:  80 PR Interval:    QRS Duration: 106 QT Interval:  374 QTC Calculation: 432 R Axis:   72 Text Interpretation: Sinus rhythm No previous ECGs available Confirmed by Gerlene Fee [phone]) on 10/27/2019 6:55:44 AM      Labs Reviewed   CBC WITH DIFFERENTIAL/PLATELET - Abnormal; Notable for the following components:      Result Value   WBC 11.2 (*)    Neutro Abs 9.7 (*)    Abs Immature Granulocytes 0.08 (*)    All other components within normal limits  COMPREHENSIVE METABOLIC PANEL - Abnormal; Notable for the following components:   Glucose, Bld 121 (*)    BUN 23 (*)    Creatinine, Ser 1.39 (*)    Calcium 8.4 (*)    Albumin 3.3 (*)    AST 52 (*)    ALT 53 (*)    GFR calc non Af Amer 59 (*)    All other components within normal limits  D-DIMER, QUANTITATIVE (NOT AT [facility]) - Abnormal; Notable for the following components:   D-Dimer, Quant 2.35 (*)    All other components within normal limits  LACTATE DEHYDROGENASE - Abnormal; Notable for the following components:   LDH 398 (*)    All other components within normal limits  FIBRINOGEN - Abnormal; Notable for the following components:   Fibrinogen >800 (*)    All other components within normal limits  CULTURE, BLOOD (ROUTINE X 2)  CULTURE, BLOOD (ROUTINE X 2)  RESPIRATORY PANEL BY RT PCR (FLU A&B, COVID)  LACTIC ACID, PLASMA  TRIGLYCERIDES  PROCALCITONIN  FERRITIN  C-REACTIVE PROTEIN  POC SARS CORONAVIRUS 2 AG -  ED    DG Chest Port 1 View  Final Result      Medications  dexamethasone (DECADRON) injection 6 mg (6 mg Intravenous Given 10/27/19 0601)     Procedures  /  Critical Care .Critical Care Performed by: Maudie Flakes, MD Authorized by: Maudie Flakes, MD   Critical care provider statement:    Critical care time (minutes):  32   Critical care was necessary to treat or prevent imminent or life-threatening deterioration of the following conditions: COVID-19 with hypoxia.   Critical care was time spent personally by me on the following activities:  Discussions with consultants, evaluation of patient's response to treatment, examination of patient, ordering and performing treatments and interventions, ordering and review of laboratory studies,  ordering and review of radiographic studies, pulse oximetry, re-evaluation of patient's condition, obtaining history from patient or surrogate and review of old charts    ED Course and Medical Decision Making  I have reviewed the triage vital signs, the nursing notes, and pertinent available records from the EMR.  Listed above are laboratory and imaging tests that I personally ordered, reviewed, and interpreted and then considered in my medical decision making (see below for details).      COVID-19 with worsening shortness of breath.  I assessed the patient without oxygen while walking in place for less than 1 minute and he desaturated down to 86%, became tachypneic, and this activity triggered a lot of coughing.  He improved to only 92% on 2 L, 95% on 3 L.  Suspect worsening pulmonary involvement.  Will need admission.    Barth Kirks. Sedonia Small, MD [place] [email or web address]  Final Clinical Impressions(s) / ED Diagnoses     ICD-10-CM   1. COVID-19  U07.1   2. Hypoxia  R09.02     ED Discharge Orders    None       Discharge Instructions Discussed with and Provided to Patient:   Discharge Instructions   None       Maudie Flakes, MD 10/27/19 [phone]

## 2019-10-27 NOTE — Plan of Care (Signed)
  Problem: Education: Goal: Knowledge of General Education information will improve Description: Including pain rating scale, medication(s)/side effects and non-pharmacologic comfort measures Outcome: Progressing   Problem: Clinical Measurements: Goal: Respiratory complications will improve Outcome: Progressing Goal: Cardiovascular complication will be avoided Outcome: Progressing   Problem: Activity: Goal: Risk for activity intolerance will decrease Outcome: Progressing   Problem: Nutrition: Goal: Adequate nutrition will be maintained Outcome: Progressing   Problem: Coping: Goal: Level of anxiety will decrease Outcome: Progressing   Problem: Elimination: Goal: Will not experience complications related to bowel motility Outcome: Progressing Goal: Will not experience complications related to urinary retention Outcome: Progressing   Problem: Pain Managment: Goal: General experience of comfort will improve Outcome: Progressing   Problem: Safety: Goal: Ability to remain free from injury will improve Outcome: Progressing   Problem: Skin Integrity: Goal: Risk for impaired skin integrity will decrease Outcome: Progressing   

## 2019-10-27 NOTE — H&P (Signed)
History and Physical    Tom Wilson [id number] DOB: 1971/06/28 DOA: 10/27/2019  PCP: Celene Squibb, MD   Patient coming from: Home  I have personally briefly reviewed patient's old medical records in [facility]  Chief Complaint: Shortness of breath  HPI: Tom Wilson is a 49 y.o. male with past medical history of attention deficit disorder presented with worsening shortness of breath.  Patient states that he has been having cold/flulike symptoms for the last 10 to 11 days with myalgia, decreased appetite, intermittent fevers and chills.  He was diagnosed with COVID-19 apparently 5 days ago.  He has been having mostly dry cough with worsening shortness of breath over the last 2 days.  He currently feels short of breath even with minimal exertion.  Complains of some chest pain with excessive coughing.  No loss of consciousness, seizures, abdominal pain, diarrhea, dysuria, rash.  Lives with wife and daughter and states that they are both doing well.  ED Course: He was found to be hypoxic requiring supplemental oxygen and chest x-ray showed patchy peripheral airspace disease bilaterally.  He was started on Decadron and remdesivir as ordered.  Hospitalist service was called to evaluate the patient  Review of Systems: As per HPI otherwise all  systems were reviewed and are negative.   Past Medical History:  Diagnosis Date  . ADD (attention deficit disorder)     Past Surgical History:  Procedure Laterality Date  . ACHILLES TENDON SURGERY Left 11/03/2013   Procedure: LEFT ACHILLES TENDON REPAIR;  Surgeon: Wylene Simmer, MD;  Location: [facility];  Service: Orthopedics;  Laterality: Left;  . COLONOSCOPY    . LIPOMA EXCISION    . WISDOM TOOTH EXTRACTION     Social history  reports that he has never smoked. He has never used smokeless tobacco. He reports that he does not drink alcohol or use drugs.  No Known Allergies  Family History  Problem Relation Age of Onset  .  Multiple sclerosis Mother   . Cancer Father        lung cancer  . Heart failure Father     Prior to Admission medications   Medication Sig Start Date End Date Taking? Authorizing Provider  methylphenidate (CONCERTA) 27 MG CR tablet Take 27 mg by mouth every morning.    [provider]  methylphenidate [facility]) 15 mg/9hr Place 15 mg onto the skin daily. wear patch for 9 hours only each day    [provider]  MYDAYIS 50 MG CP24 Take 50 mg by mouth every morning.  10/24/19   [provider]  tamsulosin (FLOMAX) 0.4 MG CAPS capsule Take 1 capsule (0.4 mg total) by mouth daily. 06/13/19   Robyn Haber, MD    Physical Exam: Vitals:   10/27/19 0529 10/27/19 0530 10/27/19 0700 10/27/19 0800  BP: 109/74 109/74 121/74 131/79  Pulse: 79 77 82 84  Resp: (!) 31 (!) 30 (!) 28 (!) 32  Temp:      SpO2: 96% 96%  92%  Weight:      Height:        Constitutional: NAD, calm, comfortable.  Currently on 2 to 3 L supplemental oxygen via nasal cannula Vitals:   10/27/19 0529 10/27/19 0530 10/27/19 0700 10/27/19 0800  BP: 109/74 109/74 121/74 131/79  Pulse: 79 77 82 84  Resp: (!) 31 (!) 30 (!) 28 (!) 32  Temp:      SpO2: 96% 96%  92%  Weight:  Height:       Eyes: PERRL, lids and conjunctivae normal ENMT: Mucous membranes are moist. Posterior pharynx clear of any exudate or lesions. Neck: normal, supple, no masses, no thyromegaly Respiratory: bilateral decreased breath sounds at bases, no wheezing; some scattered crackles.  Intermittently tachypneic no accessory muscle use.  Cardiovascular: S1 S2 positive, rate controlled. No extremity edema. 2+ pedal pulses.  Abdomen: no tenderness, no masses palpated. No hepatosplenomegaly. Bowel sounds positive.  Musculoskeletal: no clubbing / cyanosis. No joint deformity upper and lower extremities.  Skin: no rashes, lesions, ulcers. No induration Neurologic: CN 2-12 grossly intact. Moving extremities. No focal neurologic  deficits.  Psychiatric: Normal judgment and insight. Alert and oriented x 3. Normal mood.     Labs on Admission: I have personally reviewed following labs and imaging studies  CBC: Recent Labs  Lab 10/27/19 0553  WBC 11.2*  NEUTROABS 9.7*  HGB 13.5  HCT 40.5  MCV 85.6  PLT 99991111   Basic Metabolic Panel: Recent Labs  Lab 10/27/19 0553  NA 137  K 3.6  CL 102  CO2 25  GLUCOSE 121*  BUN 23*  CREATININE 1.39*  CALCIUM 8.4*   GFR: Estimated Creatinine Clearance: 77.8 mL/min (A) (by C-G formula based on SCr of 1.39 mg/dL (H)). Liver Function Tests: Recent Labs  Lab 10/27/19 0553  AST 52*  ALT 53*  ALKPHOS 68  BILITOT 0.9  PROT 6.9  ALBUMIN 3.3*   No results for input(s): LIPASE, AMYLASE in the last 168 hours. No results for input(s): AMMONIA in the last 168 hours. Coagulation Profile: No results for input(s): INR, PROTIME in the last 168 hours. Cardiac Enzymes: No results for input(s): CKTOTAL, CKMB, CKMBINDEX, TROPONINI in the last 168 hours. BNP (last 3 results) No results for input(s): PROBNP in the last 8760 hours. HbA1C: No results for input(s): HGBA1C in the last 72 hours. CBG: No results for input(s): GLUCAP in the last 168 hours. Lipid Profile: Recent Labs    10/27/19 0553  TRIG 92   Thyroid Function Tests: No results for input(s): TSH, T4TOTAL, FREET4, T3FREE, THYROIDAB in the last 72 hours. Anemia Panel: Recent Labs    10/27/19 0553  FERRITIN 2,389*   Urine analysis:    Component Value Date/Time   LABSPEC >=1.030 06/13/2019 2024   PHURINE 6.0 06/13/2019 2024   GLUCOSEU NEGATIVE 06/13/2019 2024   HGBUR MODERATE (A) 06/13/2019 2024   [place] NEGATIVE 06/13/2019 2024   KETONESUR NEGATIVE 06/13/2019 2024   PROTEINUR NEGATIVE 06/13/2019 2024   UROBILINOGEN 0.2 06/13/2019 2024   NITRITE NEGATIVE 06/13/2019 2024   LEUKOCYTESUR NEGATIVE 06/13/2019 2024    Radiological Exams on Admission: DG Chest Port 1 View  Result Date:  10/27/2019 CLINICAL DATA:  Shortness of breath. COVID positive. EXAM: PORTABLE CHEST 1 VIEW COMPARISON:  None. FINDINGS: Heart size is exaggerated by low lung volumes. Patchy peripheral airspace opacities are present bilaterally, left greater than right. Mild pulmonary vascular congestion is present. The visualized soft tissues and bony thorax are unremarkable. IMPRESSION: 1. Patchy peripheral airspace disease bilaterally, left greater than right. The findings are consistent with viral pneumonia. 2. Mild pulmonary vascular congestion. 3. Low lung volumes. Electronically Signed   By: San Morelle M.D.   On: 10/27/2019 05:06    EKG: Independently reviewed.  Normal sinus rhythm with no ST elevations or depressions.  Assessment/Plan  COVID-19 pneumonia Hypoxia -Patient is having viral symptoms for the last 11 days and was tested positive for COVID-19 around 5 days ago -Presented with  hypoxia and is currently requiring 2 to 3 L oxygen along with patchy bilateral infiltrates -Continue Decadron and IV remdesivir -Continue oxygen supplementation and wean off as able -Inflammatory markers are elevated.  Continue monitoring inflammatory markers.    Leukocytosis -Most likely from above.  Monitor  Mild transaminitis -Probably from viral syndrome.  Monitor  History of attention deficit disorder -Continue methylphenidate  DVT prophylaxis: Lovenox prophylactic dosing Code Status: Full Family Communication: Spoke to patient at bedside Disposition Plan: Patient will need several days of inpatient hospitalization for IV steroids and remdesivir Consults called: None Admission status: Inpatient/telemetry  Severity of Illness: The appropriate patient status for this patient is INPATIENT. Inpatient status is judged to be reasonable and necessary in order to provide the required intensity of service to ensure the patient's safety. The patient's presenting symptoms, physical exam findings, and  initial radiographic and laboratory data in the context of their chronic comorbidities is felt to place them at high risk for further clinical deterioration. Furthermore, it is not anticipated that the patient will be medically stable for discharge from the hospital within 2 midnights of admission. The following factors support the patient status of inpatient.   " The patient's presenting symptoms include shortness of breath/flulike symptoms. " The worrisome physical exam findings include tachypnea/scattered crackles. " The initial radiographic and laboratory data are worrisome because of patchy bilateral infiltrates. " The chronic co-morbidities include attention deficit disorder.   * I certify that at the point of admission it is my clinical judgment that the patient will require inpatient hospital care spanning beyond 2 midnights from the point of admission due to high intensity of service, high risk for further deterioration and high frequency of surveillance required.Aline August MD [facility]  10/27/2019, 8:29 AM

## 2019-10-27 NOTE — ED Notes (Signed)
ED TO INPATIENT HANDOFF REPORT  ED Nurse Name and Phone #: [phone]  S Name/Age/Gender Tom Wilson 49 y.o. male Room/Bed: WA09/WA09  Code Status   Code Status: Full Code  Home/SNF/Other Home Patient oriented to: self, place, time and situation Is this baseline? Yes   Triage Complete: Triage complete  Chief Complaint Pneumonia due to COVID-19 virus [U07.1, J12.82]  Triage Note Pt reports that he tested positive for COVID on Friday at the doctors office. He reports increased SOB since. He is ambulatory, calm and in no distress, and speaking in complete sentences in triage.     Allergies No Known Allergies  Level of Care/Admitting Diagnosis ED Disposition    ED Disposition Condition Comment   Admit  Hospital Area: [facility] [[id number]]  Level of Care: Telemetry [5]  Admit to tele based on following criteria: Other see comments  Comments: Hypoxia  May [facility] patient to [facility] if equivalent level of care is available:: Yes  Covid Evaluation: Confirmed COVID Positive  Diagnosis: Pneumonia due to COVID-19 virus [[id number]]  Admitting Physician: Aline August [[id number]]  Attending Physician: Aline August [[id number]]  Estimated length of stay: 5 - 7 days  Certification:: I certify this patient will need inpatient services for at least 2 midnights       B Medical/Surgery History Past Medical History:  Diagnosis Date  . ADD (attention deficit disorder)    Past Surgical History:  Procedure Laterality Date  . ACHILLES TENDON SURGERY Left 11/03/2013   Procedure: LEFT ACHILLES TENDON REPAIR;  Surgeon: Wylene Simmer, MD;  Location: [facility];  Service: Orthopedics;  Laterality: Left;  . COLONOSCOPY    . LIPOMA EXCISION    . WISDOM TOOTH EXTRACTION       A IV Location/Drains/Wounds Patient Lines/Drains/Airways Status   Active Line/Drains/Airways    Name:   Placement date:   Placement time:   Site:   Days:   Peripheral IV 10/27/19 Right Antecubital   10/27/19    0544    Antecubital   less than 1   Peripheral IV 10/27/19 Right Hand   10/27/19    0550    Hand   less than 1   Incision (Closed) 11/03/13 Foot Left   11/03/13    0957     2184          Intake/Output Last 24 hours No intake or output data in the 24 hours ending 10/27/19 0844  Labs/Imaging Results for orders placed or performed during the hospital encounter of 10/27/19 (from the past 48 hour(s))  Lactic acid, plasma     Status: None   Collection Time: 10/27/19  5:53 AM  Result Value Ref Range   Lactic Acid, Venous 1.2 0.5 - 1.9 mmol/L    Comment: Performed at [facility], [place] [address] [place], [place] [address]  CBC WITH DIFFERENTIAL     Status: Abnormal   Collection Time: 10/27/19  5:53 AM  Result Value Ref Range   WBC 11.2 (H) 4.0 - 10.5 K/uL   RBC 4.73 4.22 - 5.81 MIL/uL   Hemoglobin 13.5 13.0 - 17.0 g/dL   HCT 40.5 39.0 - 52.0 %   MCV 85.6 80.0 - 100.0 fL   MCH 28.5 26.0 - 34.0 pg   MCHC 33.3 30.0 - 36.0 g/dL   RDW 14.4 11.5 - 15.5 %   Platelets 271 150 - 400 K/uL   nRBC 0.0 0.0 - 0.2 %   Neutrophils Relative %  87 %   Neutro Abs 9.7 (H) 1.7 - 7.7 K/uL   Lymphocytes Relative 8 %   Lymphs Abs 0.9 0.7 - 4.0 K/uL   Monocytes Relative 4 %   Monocytes Absolute 0.5 0.1 - 1.0 K/uL   Eosinophils Relative 0 %   Eosinophils Absolute 0.0 0.0 - 0.5 K/uL   Basophils Relative 0 %   Basophils Absolute 0.0 0.0 - 0.1 K/uL   Immature Granulocytes 1 %   Abs Immature Granulocytes 0.08 (H) 0.00 - 0.07 K/uL    Comment: Performed at [facility], [place] [address] [place], [place] [address]  Comprehensive metabolic panel     Status: Abnormal   Collection Time: 10/27/19  5:53 AM  Result Value Ref Range   Sodium 137 135 - 145 mmol/L   Potassium 3.6 3.5 - 5.1 mmol/L   Chloride 102 98 - 111 mmol/L   CO2 25 22 - 32 mmol/L   Glucose, Bld 121 (H) 70 - 99 mg/dL    Comment: Glucose reference range  applies only to samples taken after fasting for at least 8 hours.   BUN 23 (H) 6 - 20 mg/dL   Creatinine, Ser 1.39 (H) 0.61 - 1.24 mg/dL   Calcium 8.4 (L) 8.9 - 10.3 mg/dL   Total Protein 6.9 6.5 - 8.1 g/dL   Albumin 3.3 (L) 3.5 - 5.0 g/dL   AST 52 (H) 15 - 41 U/L   ALT 53 (H) 0 - 44 U/L   Alkaline Phosphatase 68 38 - 126 U/L   Total Bilirubin 0.9 0.3 - 1.2 mg/dL   GFR calc non Af Amer 59 (L) >60 mL/min   GFR calc Af Amer >60 >60 mL/min   Anion gap 10 5 - 15    Comment: Performed at [facility], [place] [address] [place], [place] [address]  D-dimer, quantitative     Status: Abnormal   Collection Time: 10/27/19  5:53 AM  Result Value Ref Range   D-Dimer, Quant 2.35 (H) 0.00 - 0.50 ug/mL-FEU    Comment: (NOTE) At the manufacturer cut-off of 0.50 ug/mL FEU, this assay has been documented to exclude PE with a sensitivity and negative predictive value of 97 to 99%.  At this time, this assay has not been approved by the FDA to exclude DVT/VTE. Results should be correlated with clinical presentation. Performed at [facility], [place] [address] [place], [place] [address]   Procalcitonin     Status: None   Collection Time: 10/27/19  5:53 AM  Result Value Ref Range   Procalcitonin 0.60 ng/mL    Comment:        Interpretation: PCT > 0.5 ng/mL and <= 2 ng/mL: Systemic infection (sepsis) is possible, but other conditions are known to elevate PCT as well. (NOTE)       Sepsis PCT Algorithm           Lower Respiratory Tract                                      Infection PCT Algorithm    ----------------------------     ----------------------------         PCT < 0.25 ng/mL                PCT < 0.10 ng/mL         Strongly encourage  Strongly discourage   discontinuation of antibiotics    initiation of antibiotics    ----------------------------     -----------------------------       PCT 0.25 - 0.50 ng/mL            PCT 0.10 - 0.25 ng/mL                OR       >80% decrease in PCT            Discourage initiation of                                            antibiotics      Encourage discontinuation           of antibiotics    ----------------------------     -----------------------------         PCT >= 0.50 ng/mL              PCT 0.26 - 0.50 ng/mL                AND       <80% decrease in PCT             Encourage initiation of                                             antibiotics       Encourage continuation           of antibiotics    ----------------------------     -----------------------------        PCT >= 0.50 ng/mL                  PCT > 0.50 ng/mL               AND         increase in PCT                  Strongly encourage                                      initiation of antibiotics    Strongly encourage escalation           of antibiotics                                     -----------------------------                                           PCT <= 0.25 ng/mL                                                 OR                                        >  80% decrease in PCT                                     Discontinue / Do not initiate                                             antibiotics Performed at [facility] [address] [place], [place] [address]   Lactate dehydrogenase     Status: Abnormal   Collection Time: 10/27/19  5:53 AM  Result Value Ref Range   LDH 398 (H) 98 - 192 U/L    Comment: Performed at [facility], [place] [address], [place], [place] [address]  Ferritin     Status: Abnormal   Collection Time: 10/27/19  5:53 AM  Result Value Ref Range   Ferritin 2,389 (H) 24 - 336 ng/mL    Comment: Performed at [facility], [place] [address] [place], [place] [address]  Triglycerides     Status: None   Collection Time: 10/27/19  5:53 AM  Result Value Ref Range   Triglycerides 92 <150 mg/dL    Comment: Performed at [facility], [place] [address] [place], [place] [address]  Fibrinogen     Status: Abnormal   Collection Time: 10/27/19  5:53 AM  Result Value Ref Range   Fibrinogen >800 (H) 210 - 475 mg/dL    Comment: Performed at [facility], [place] [address], [place], [place] [address]  C-reactive protein     Status: Abnormal   Collection Time: 10/27/19  5:53 AM  Result Value Ref Range   CRP 30.6 (H) <1.0 mg/dL    Comment: Performed at [facility], [place] [address], [place], [place] [address]  POC SARS Coronavirus 2 Ag-ED - Nasal Swab (BD Veritor Kit)     Status: None   Collection Time: 10/27/19  6:27 AM  Result Value Ref Range   SARS Coronavirus 2 Ag NEGATIVE NEGATIVE    Comment: (NOTE) SARS-CoV-2 antigen NOT DETECTED.  Negative results are presumptive.  Negative results do not preclude SARS-CoV-2 infection and should not be used as the sole basis for treatment or other patient management decisions, including infection  control decisions, particularly in the presence of clinical signs and  symptoms consistent with COVID-19, or in those who have been in contact with the virus.  Negative results must be combined with clinical observations, patient history, and epidemiological information. The expected result is Negative. Fact Sheet for Patients: PodPark.tn Fact Sheet for Healthcare Providers: GiftContent.is This test is not yet approved or cleared by the [place] FDA and  has been authorized for detection and/or diagnosis of SARS-CoV-2 by FDA under an Emergency Use Authorization (EUA).  This EUA will remain in effect (meaning this test can be used) for the duration of  the COVID-19 de claration under Section 564(b)(1) of the Act, 21 U.S.C. section 360bbb-3(b)(1), unless the authorization is terminated or revoked sooner.    DG Chest Port 1 View  Result Date: 10/27/2019 CLINICAL DATA:   Shortness of breath. COVID positive. EXAM: PORTABLE CHEST 1 VIEW COMPARISON:  None. FINDINGS: Heart size is exaggerated by low lung volumes. Patchy peripheral airspace opacities are present bilaterally, left greater than right. Mild pulmonary vascular congestion is present.  The visualized soft tissues and bony thorax are unremarkable. IMPRESSION: 1. Patchy peripheral airspace disease bilaterally, left greater than right. The findings are consistent with viral pneumonia. 2. Mild pulmonary vascular congestion. 3. Low lung volumes. Electronically Signed   By: San Morelle M.D.   On: 10/27/2019 05:06    Pending Labs Unresulted Labs (From admission, onward)    Start     Ordered   11/03/19 0500  Creatinine, serum  (enoxaparin (LOVENOX)    CrCl >/= 30 ml/min)  Weekly,   R    Comments: while on enoxaparin therapy    10/27/19 0802   10/28/19 0500  HIV Antibody (routine testing w rflx)  (HIV Antibody (Routine testing w reflex) panel)  Tomorrow morning,   R     10/27/19 0802   10/28/19 0500  CBC with Differential/Platelet  Daily,   R     10/27/19 0802   10/28/19 0500  Comprehensive metabolic panel  Daily,   R     10/27/19 0802   10/28/19 0500  C-reactive protein  Daily,   R     10/27/19 0802   10/28/19 0500  D-dimer, quantitative (not at [facility])  Daily,   R     10/27/19 0802   10/28/19 0500  Ferritin  Daily,   R     10/27/19 0802   10/28/19 0500  Magnesium  Daily,   R     10/27/19 0802   10/28/19 0500  Phosphorus  Daily,   R     10/27/19 0802   10/28/19 0500  Procalcitonin - Baseline  Tomorrow morning,   STAT     10/27/19 0802   10/27/19 0800  CBC  (enoxaparin (LOVENOX)    CrCl >/= 30 ml/min)  Once,   STAT    Comments: Baseline for enoxaparin therapy IF NOT ALREADY DRAWN.  Notify MD if PLT < 100 K.    10/27/19 0802   10/27/19 0800  Creatinine, serum  (enoxaparin (LOVENOX)    CrCl >/= 30 ml/min)  Once,   STAT    Comments: Baseline for enoxaparin therapy IF NOT ALREADY DRAWN.    10/27/19  0802   10/27/19 0629  Respiratory Panel by RT PCR (Flu A&B, Covid) - Nasopharyngeal Swab  (Tier 2 Respiratory Panel by RT PCR (Flu A&B, Covid) (TAT 2 hrs))  Once,   STAT    Question Answer Comment  Is this test for diagnosis or screening Screening   Symptomatic for COVID-19 as defined by CDC No   Hospitalized for COVID-19 No   Admitted to ICU for COVID-19 No   Previously tested for COVID-19 Yes   Resident in a congregate (group) care setting Unknown   Employed in healthcare setting Unknown   Has patient completed COVID vaccination(s) (2 doses of Pfizer/Moderna 1 dose of [employer]) Unknown      10/27/19 0628   10/27/19 0441  Blood Culture (routine x 2)  BLOOD CULTURE X 2,   STAT     10/27/19 0440          Vitals/Pain Today's Vitals   10/27/19 0530 10/27/19 0700 10/27/19 0800 10/27/19 0830  BP: 109/74 121/74 131/79 134/79  Pulse: 77 82 84 83  Resp: (!) 30 (!) 28 (!) 32 (!) 35  Temp:      SpO2: 96%  92% 93%  Weight:      Height:        Isolation Precautions Airborne and Contact precautions  Medications Medications  remdesivir 100 mg  in sodium chloride 0.9 % 100 mL IVPB (has no administration in time range)  remdesivir 100 mg in sodium chloride 0.9 % 100 mL IVPB (has no administration in time range)  methylphenidate (CONCERTA) CR tablet 27 mg (has no administration in time range)  tamsulosin (FLOMAX) capsule 0.4 mg (has no administration in time range)  enoxaparin (LOVENOX) injection 40 mg (has no administration in time range)  dexamethasone (DECADRON) tablet 6 mg (has no administration in time range)  acetaminophen (TYLENOL) tablet 650 mg (has no administration in time range)  ondansetron (ZOFRAN) tablet 4 mg (has no administration in time range)    Or  ondansetron (ZOFRAN) injection 4 mg (has no administration in time range)  guaiFENesin-dextromethorphan (ROBITUSSIN DM) 100-10 MG/5ML syrup 10 mL (has no administration in time range)  zinc sulfate capsule 220 mg  (has no administration in time range)  ascorbic acid (VITAMIN C) tablet 500 mg (has no administration in time range)  dexamethasone (DECADRON) injection 6 mg (6 mg Intravenous Given 10/27/19 0601)    Mobility walks Low fall risk   Focused Assessments Pulmonary Assessment Handoff:  Lung sounds: Bilateral Breath Sounds: Diminished O2 Device: Nasal Cannula O2 Flow Rate (L/min): 3 L/min      R Recommendations: See Admitting Provider Note  Report given to:   Additional Notes:

## 2019-10-27 NOTE — ED Notes (Signed)
Admission doctor at bedside speaking with patient. 

## 2019-10-27 NOTE — Plan of Care (Signed)
  Problem: Education: Goal: Knowledge of General Education information will improve Description: Including pain rating scale, medication(s)/side effects and non-pharmacologic comfort measures Outcome: Progressing   Problem: Clinical Measurements: Goal: Respiratory complications will improve Outcome: Progressing Goal: Cardiovascular complication will be avoided Outcome: Progressing   Problem: Activity: Goal: Risk for activity intolerance will decrease Outcome: Progressing   Problem: Nutrition: Goal: Adequate nutrition will be maintained Outcome: Progressing   Problem: Coping: Goal: Level of anxiety will decrease Outcome: Progressing   Problem: Elimination: Goal: Will not experience complications related to bowel motility Outcome: Progressing Goal: Will not experience complications related to urinary retention Outcome: Progressing   Problem: Pain Managment: Goal: General experience of comfort will improve Outcome: Progressing   Problem: Safety: Goal: Ability to remain free from injury will improve Outcome: Progressing   Problem: Skin Integrity: Goal: Risk for impaired skin integrity will decrease Outcome: Progressing   Problem: Education: Goal: Knowledge of risk factors and measures for prevention of condition will improve Outcome: Progressing   Problem: Coping: Goal: Psychosocial and spiritual needs will be supported Outcome: Progressing   Problem: Respiratory: Goal: Will maintain a patent airway Outcome: Progressing Goal: Complications related to the disease process, condition or treatment will be avoided or minimized Outcome: Progressing

## 2019-10-27 NOTE — Progress Notes (Signed)
Initial Nutrition Assessment  DOCUMENTATION CODES:   Obesity unspecified  INTERVENTION:  -Ensure Enlive- po BID, each supplement provides 350 kcal and 20 grams of protein.   -Magic cup with lunch and dinner -provided on meal tray  -Liberalized diet-Regular diet  NUTRITION DIAGNOSIS:   Inadequate oral intake related to acute illness(COVID-19) as evidenced by per patient/family report(-Meal intake 25% and family reports decreased appetite prior to admission).  GOAL:  Patient will meet greater than or equal to 90% of their needs   MONITOR:  PO intake, Supplement acceptance, Labs, Weight trends, Skin   REASON FOR ASSESSMENT:   Malnutrition Screening Tool    ASSESSMENT: Patient is a 49 yo male who presents with shortness of breath and tested COVID-positive 5 days ago. History of attention deficit disorder.   Meal intake: po' today 25% at lunch. Decrease in appetite prior to admission.    Medications reviewed: Remdesivir (IVPB), Ascorbic acid, zinc sulfate, decadron.  Labs reviewed: BUN 23 , Cr 1.39 (H). Ferritin-2,389 (H).  Weight  103.4 kg. Previous available weight for comparison is from 11/03/13-103.9 kg.   NUTRITION - FOCUSED PHYSICAL EXAM:  Deferred- RD working remotely  Diet Order:   Diet Order            Diet regular Room service appropriate? Yes; Fluid consistency: Thin  Diet effective now              EDUCATION NEEDS:  Not appropriate for education at this time Skin:  Skin Assessment: Reviewed RN Assessment  Last BM:  unknown  Height:   Ht Readings from Last 1 Encounters:  10/27/19 5\' 11"  (1.803 m)    Weight:   Wt Readings from Last 1 Encounters:  10/27/19 103.4 kg    Ideal Body Weight:   78 kg  BMI:  Body mass index is 31.8 kg/m.  Estimated Nutritional Needs:   Kcal:  [id number]  Protein:  125-140 gr  Fluid:  >2 liters daily   Colman Cater MS,RD,CSG,LDN Contact available: AMION

## 2019-10-27 NOTE — ED Triage Notes (Signed)
Pt reports that he tested positive for COVID on Friday at the doctors office. He reports increased SOB since. He is ambulatory, calm and in no distress, and speaking in complete sentences in triage.

## 2019-10-28 LAB — COMPREHENSIVE METABOLIC PANEL
ALT: 47 U/L — ABNORMAL HIGH (ref 0–44)
AST: 47 U/L — ABNORMAL HIGH (ref 15–41)
Albumin: 2.9 g/dL — ABNORMAL LOW (ref 3.5–5.0)
Alkaline Phosphatase: 69 U/L (ref 38–126)
Anion gap: 11 (ref 5–15)
BUN: 24 mg/dL — ABNORMAL HIGH (ref 6–20)
CO2: 25 mmol/L (ref 22–32)
Calcium: 8.8 mg/dL — ABNORMAL LOW (ref 8.9–10.3)
Chloride: 101 mmol/L (ref 98–111)
Creatinine, Ser: 1.11 mg/dL (ref 0.61–1.24)
GFR calc Af Amer: >60 mL/min (ref >60)
GFR calc non Af Amer: >60 mL/min (ref >60)
Glucose, Bld: 146 mg/dL — ABNORMAL HIGH (ref 70–99)
Potassium: 3.9 mmol/L (ref 3.5–5.1)
Sodium: 137 mmol/L (ref 135–145)
Total Bilirubin: 0.5 mg/dL (ref 0.3–1.2)
Total Protein: 6.7 g/dL (ref 6.5–8.1)

## 2019-10-28 LAB — CBC WITH DIFFERENTIAL/PLATELET
Abs Immature Granulocytes: 0.11 10*3/uL — ABNORMAL HIGH (ref 0.00–0.07)
Basophils Absolute: 0 10*3/uL (ref 0.0–0.1)
Basophils Relative: 0 %
Eosinophils Absolute: 0 10*3/uL (ref 0.0–0.5)
Eosinophils Relative: 0 %
HCT: 39.9 % (ref 39.0–52.0)
Hemoglobin: 13.1 g/dL (ref 13.0–17.0)
Immature Granulocytes: 1 %
Lymphocytes Relative: 10 %
Lymphs Abs: 1.2 10*3/uL (ref 0.7–4.0)
MCH: 27.8 pg (ref 26.0–34.0)
MCHC: 32.8 g/dL (ref 30.0–36.0)
MCV: 84.7 fL (ref 80.0–100.0)
Monocytes Absolute: 1 10*3/uL (ref 0.1–1.0)
Monocytes Relative: 9 %
Neutro Abs: 9.3 10*3/uL — ABNORMAL HIGH (ref 1.7–7.7)
Neutrophils Relative %: 80 %
Platelets: 381 10*3/uL (ref 150–400)
RBC: 4.71 MIL/uL (ref 4.22–5.81)
RDW: 14.4 % (ref 11.5–15.5)
WBC: 11.6 10*3/uL — ABNORMAL HIGH (ref 4.0–10.5)
nRBC: 0 % (ref 0.0–0.2)

## 2019-10-28 LAB — FERRITIN: Ferritin: 2475 ng/mL — ABNORMAL HIGH (ref 24–336)

## 2019-10-28 LAB — HIV ANTIBODY (ROUTINE TESTING W REFLEX): HIV Screen 4th Generation wRfx: NONREACTIVE

## 2019-10-28 LAB — PHOSPHORUS: Phosphorus: 2.3 mg/dL — ABNORMAL LOW (ref 2.5–4.6)

## 2019-10-28 LAB — C-REACTIVE PROTEIN: CRP: 28.8 mg/dL — ABNORMAL HIGH (ref <1.0)

## 2019-10-28 LAB — D-DIMER, QUANTITATIVE: D-Dimer, Quant: 1.96 ug/mL-FEU — ABNORMAL HIGH (ref 0.00–0.50)

## 2019-10-28 LAB — PROCALCITONIN: Procalcitonin: 0.76 ng/mL

## 2019-10-28 LAB — MAGNESIUM: Magnesium: 2.1 mg/dL (ref 1.7–2.4)

## 2019-10-28 MED ORDER — HYDROCOD POLST-CPM POLST ER 10-8 MG/5ML PO SUER
5.0000 mL | Freq: Two times a day (BID) | ORAL | Status: DC | PRN
  Administered 2019-10-28 – 2019-10-30 (×4): 5 mL via ORAL
  Filled 2019-10-28 (×4): qty 5

## 2019-10-28 MED ORDER — SODIUM CHLORIDE 0.9 % IV SOLN
INTRAVENOUS | Status: DC | PRN
  Administered 2019-10-28: 250 mL via INTRAVENOUS

## 2019-10-28 MED ORDER — ENOXAPARIN SODIUM 60 MG/0.6ML ~~LOC~~ SOLN
0.5000 mg/kg | SUBCUTANEOUS | Status: DC
  Administered 2019-10-28 – 2019-10-31 (×4): 50 mg via SUBCUTANEOUS
  Filled 2019-10-28 (×4): qty 0.6

## 2019-10-28 NOTE — Progress Notes (Signed)
   10/28/19 0130  Vitals  Temp (!) 100.5 F (38.1 C)  Temp Source Oral   PRN Tylenol given per order. Will recheck within an hour.

## 2019-10-28 NOTE — Progress Notes (Signed)
PROGRESS NOTE    Tom Wilson  [id number] DOB: July 28, 1970 DOA: 10/27/2019 PCP: Celene Squibb, MD      Brief Narrative:  Mr. Tom Wilson is a 49 y.o. M with no significant PMHx who presented with 1 week flu-like symptoms, myalgias, fever and now shortness of breath.  In the ER, CXR showed bilateral pneumonia.  Hypoxic requiring 2L.  COVID+.         Assessment & Plan:  Coronavirus pneumonitis with acute hypoxic respiratory failure Presented with myalgias, fever, pneumonia by  in the setting of the ongoing 2020-2021 COVID-19 pandemic.  -Continue remdesivir day 2 of 5 -Continue stroids, increase to Solumedrol BID -Continue VTE PPx with Lovenox -Continue Zinc and Vitamin C -Flutter valve, turn, cough, incentive spirometry q2hrs while awake    Transaminitis Due to COVID -Trend LFTs  Leukocytosis Procal low, doubt bacterial pneumonia  BPH -Continue tamsulosin  ADD -Hold Ritalin         Disposition: Status is: Inpatient  Remains inpatient appropriate because:IV treatments appropriate due to intensity of illness or inability to take PO and Inpatient level of care appropriate due to severity of illness   Dispo: The patient is from: Home              Anticipated d/c is to: Home              Anticipated d/c date is: 3 days              Patient currently is not medically stable to d/c.              DVT prophylaxis: Lovenox Code Status: FULL Family Communication: Will call wife by phone MDM: The below labs and imaging reports were reviewed and summarized above.  Medication management as above.       Procedures:     Antimicrobials:    4/22 CXR bilateral opacities  Culture data:   4/22 blood culture no growth      Subjective: Weak, fatigued, coughing, out of breath.  Aches, malaise.  Low fever overnight.  No confusion, diarrhea, hemoptysis.  No chest pain, leg swelling.     Objective: Vitals:   10/28/19 0500 10/28/19 0524  10/28/19 0809 10/28/19 1215  BP:  117/83 111/69 108/76  Pulse:  71 63 66  Resp: 19 (!) 25 (!) 24 18  Temp:  98.4 F (36.9 C) 98.1 F (36.7 C) 98 F (36.7 C)  TempSrc:  Oral Oral Oral  SpO2: 96% 96% 94% 98%  Weight:      Height:        Intake/Output Summary (Last 24 hours) at 10/28/2019 1407 Last data filed at 10/28/2019 1100 Gross per 24 hour  Intake 740 ml  Output --  Net 740 ml   Filed Weights   10/27/19 0423  Weight: 103.4 kg    Examination: General appearance: Well-nourished adult male, alert and in mild distress.  Appears wiped out, very tired. HEENT: Anicteric, conjunctiva pink, lids and lashes normal. No nasal deformity, discharge, epistaxis.  Lips moist, dentition normal, oropharynx tacky dry, no oral lesions, hearing normal.   Skin: Warm and dry.  No jaundice.  No suspicious rashes or lesions. Cardiac: RRR, nl S1-S2, no murmurs appreciated.  Capillary refill is brisk.  JVP normal.  No LE edema.  Radial pulses 2+ and symmetric. Respiratory: Tachypneic, worse with exertion.  Lung sounds overall diminished, no rales or wheezes. Abdomen: Abdomen soft.  No TTP or guarding. No ascites, distension, hepatosplenomegaly.  MSK: No deformities or effusions.  Normal muscle bulk and tone. Neuro: Awake and alert.  EOMI, moves all extremities. Speech fluent.    Psych: Sensorium intact and responding to questions, attention normal. Affect blunted.  Judgment and insight appear normal.          Data Reviewed: I have personally reviewed following labs and imaging studies:  CBC: Recent Labs  Lab 10/27/19 0553 10/28/19 0156  WBC 11.2* 11.6*  NEUTROABS 9.7* 9.3*  HGB 13.5 13.1  HCT 40.5 39.9  MCV 85.6 84.7  PLT 271 [id number]   Basic Metabolic Panel: Recent Labs  Lab 10/27/19 0553 10/28/19 0156  NA 137 137  K 3.6 3.9  CL 102 101  CO2 25 25  GLUCOSE 121* 146*  BUN 23* 24*  CREATININE 1.39* 1.11  CALCIUM 8.4* 8.8*  MG  --  2.1  PHOS  --  2.3*   GFR: Estimated  Creatinine Clearance: 97.4 mL/min (by C-G formula based on SCr of 1.11 mg/dL). Liver Function Tests: Recent Labs  Lab 10/27/19 0553 10/28/19 0156  AST 52* 47*  ALT 53* 47*  ALKPHOS 68 69  BILITOT 0.9 0.5  PROT 6.9 6.7  ALBUMIN 3.3* 2.9*   No results for input(s): LIPASE, AMYLASE in the last 168 hours. No results for input(s): AMMONIA in the last 168 hours. Coagulation Profile: No results for input(s): INR, PROTIME in the last 168 hours. Cardiac Enzymes: No results for input(s): CKTOTAL, CKMB, CKMBINDEX, TROPONINI in the last 168 hours. BNP (last 3 results) No results for input(s): PROBNP in the last 8760 hours. HbA1C: No results for input(s): HGBA1C in the last 72 hours. CBG: No results for input(s): GLUCAP in the last 168 hours. Lipid Profile: Recent Labs    10/27/19 0553  TRIG 92   Thyroid Function Tests: No results for input(s): TSH, T4TOTAL, FREET4, T3FREE, THYROIDAB in the last 72 hours. Anemia Panel: Recent Labs    10/27/19 0553 10/28/19 0156  FERRITIN 2,389* 2,475*   Urine analysis:    Component Value Date/Time   LABSPEC >=1.030 06/13/2019 2024   PHURINE 6.0 06/13/2019 2024   GLUCOSEU NEGATIVE 06/13/2019 2024   HGBUR MODERATE (A) 06/13/2019 2024   BILIRUBINUR NEGATIVE 06/13/2019 2024   KETONESUR NEGATIVE 06/13/2019 2024   PROTEINUR NEGATIVE 06/13/2019 2024   UROBILINOGEN 0.2 06/13/2019 2024   NITRITE NEGATIVE 06/13/2019 2024   LEUKOCYTESUR NEGATIVE 06/13/2019 2024   Sepsis Labs: @LABRCNTIP (procalcitonin:4,lacticacidven:4)  ) Recent Results (from the past 240 hour(s))  Blood Culture (routine x 2)     Status: None (Preliminary result)   Collection Time: 10/27/19  5:53 AM   Specimen: BLOOD  Result Value Ref Range Status   Specimen Description   Final    BLOOD RIGHT ANTECUBITAL Performed at [facility], [place] [address] [place], [place] [address]    Special Requests   Final    BOTTLES DRAWN AEROBIC AND ANAEROBIC Blood Culture  adequate volume Performed at [facility] [address], [place], [place] [address]    Culture   Final    NO GROWTH 1 DAY Performed at [facility], [place] [address] [place], [place] [address]    Report Status PENDING  Incomplete  Blood Culture (routine x 2)     Status: None (Preliminary result)   Collection Time: 10/27/19  5:53 AM   Specimen: BLOOD RIGHT HAND  Result Value Ref Range Status   Specimen Description   Final    BLOOD RIGHT HAND Performed at  [facility], [place] [address], [place], [place] [address]    Special Requests   Final    BOTTLES DRAWN AEROBIC AND ANAEROBIC Blood Culture adequate volume Performed at [facility] [address], [place], [place] [address]    Culture   Final    NO GROWTH 1 DAY Performed at [facility], [place] [address] [place], [place] [address]    Report Status PENDING  Incomplete  Respiratory Panel by RT PCR (Flu A&B, Covid) - Nasopharyngeal Swab     Status: Abnormal   Collection Time: 10/27/19  6:56 AM   Specimen: Nasopharyngeal Swab  Result Value Ref Range Status   SARS Coronavirus 2 by RT PCR POSITIVE (A) NEGATIVE Final    Comment: RESULT CALLED TO, READ BACK BY AND VERIFIED WITH: MOORE,C. RN AT [id number] 10/27/19 MULLINS,T (NOTE) SARS-CoV-2 target nucleic acids are DETECTED. SARS-CoV-2 RNA is generally detectable in upper respiratory specimens  during the acute phase of infection. Positive results are indicative of the presence of the identified virus, but do not rule out bacterial infection or co-infection with other pathogens not detected by the test. Clinical correlation with patient history and other diagnostic information is necessary to determine patient infection status. The expected result is Negative. Fact Sheet for Patients:  PinkCheek.be Fact Sheet for Healthcare Providers: GravelBags.it This test is  not yet approved or cleared by the [place] FDA and  has been authorized for detection and/or diagnosis of SARS-CoV-2 by FDA under an Emergency Use Authorization (EUA).  This EUA will remain in effect (meaning this test can be used ) for the duration of  the COVID-19 declaration under Section 564(b)(1) of the Act, 21 U.S.C. section 360bbb-3(b)(1), unless the authorization is terminated or revoked sooner.    Influenza A by PCR NEGATIVE NEGATIVE Final   Influenza B by PCR NEGATIVE NEGATIVE Final    Comment: (NOTE) The Xpert Xpress SARS-CoV-2/FLU/RSV assay is intended as an aid in  the diagnosis of influenza from Nasopharyngeal swab specimens and  should not be used as a sole basis for treatment. Nasal washings and  aspirates are unacceptable for Xpert Xpress SARS-CoV-2/FLU/RSV  testing. Fact Sheet for Patients: PinkCheek.be Fact Sheet for Healthcare Providers: GravelBags.it This test is not yet approved or cleared by the [place] FDA and  has been authorized for detection and/or diagnosis of SARS-CoV-2 by  FDA under an Emergency Use Authorization (EUA). This EUA will remain  in effect (meaning this test can be used) for the duration of the  Covid-19 declaration under Section 564(b)(1) of the Act, 21  U.S.C. section 360bbb-3(b)(1), unless the authorization is  terminated or revoked. Performed at [facility], [place] [address], [place], [place] [address]          Radiology Studies: [facility] Chest Port 1 View  Result Date: 10/27/2019 CLINICAL DATA:  Shortness of breath. COVID positive. EXAM: PORTABLE CHEST 1 VIEW COMPARISON:  None. FINDINGS: Heart size is exaggerated by low lung volumes. Patchy peripheral airspace opacities are present bilaterally, left greater than right. Mild pulmonary vascular congestion is present. The visualized soft tissues and bony thorax are unremarkable. IMPRESSION: 1. Patchy  peripheral airspace disease bilaterally, left greater than right. The findings are consistent with viral pneumonia. 2. Mild pulmonary vascular congestion. 3. Low lung volumes. Electronically Signed   By: San Morelle M.D.   On: 10/27/2019 05:06        Scheduled Meds: . vitamin C  500 mg Oral Daily  .  dexamethasone  6 mg Oral Q24H  . enoxaparin (LOVENOX) injection  0.5 mg/kg Subcutaneous Q24H  . feeding supplement (ENSURE ENLIVE)  237 mL Oral TID BM  . tamsulosin  0.4 mg Oral QPC supper  . zinc sulfate  220 mg Oral Daily   Continuous Infusions: . sodium chloride 250 mL (10/28/19 1009)  . remdesivir 100 mg in NS 100 mL 100 mg (10/28/19 1011)     LOS: 1 day    Time spent: 25 minutes      Edwin Dada, MD [facility] 10/28/2019, 2:07 PM     Please page through [place]:  [email or web address] Contact charge nurse for password If 7PM-7AM, please contact night-coverage

## 2019-10-29 LAB — COMPREHENSIVE METABOLIC PANEL
ALT: 44 U/L (ref 0–44)
AST: 40 U/L (ref 15–41)
Albumin: 2.8 g/dL — ABNORMAL LOW (ref 3.5–5.0)
Alkaline Phosphatase: 63 U/L (ref 38–126)
Anion gap: 10 (ref 5–15)
BUN: 29 mg/dL — ABNORMAL HIGH (ref 6–20)
CO2: 25 mmol/L (ref 22–32)
Calcium: 9 mg/dL (ref 8.9–10.3)
Chloride: 103 mmol/L (ref 98–111)
Creatinine, Ser: 0.95 mg/dL (ref 0.61–1.24)
GFR calc Af Amer: >60 mL/min (ref >60)
GFR calc non Af Amer: >60 mL/min (ref >60)
Glucose, Bld: 149 mg/dL — ABNORMAL HIGH (ref 70–99)
Potassium: 4.4 mmol/L (ref 3.5–5.1)
Sodium: 138 mmol/L (ref 135–145)
Total Bilirubin: 0.9 mg/dL (ref 0.3–1.2)
Total Protein: 6.4 g/dL — ABNORMAL LOW (ref 6.5–8.1)

## 2019-10-29 LAB — CBC WITH DIFFERENTIAL/PLATELET
Abs Immature Granulocytes: 0.18 10*3/uL — ABNORMAL HIGH (ref 0.00–0.07)
Basophils Absolute: 0 10*3/uL (ref 0.0–0.1)
Basophils Relative: 0 %
Eosinophils Absolute: 0 10*3/uL (ref 0.0–0.5)
Eosinophils Relative: 0 %
HCT: 39.3 % (ref 39.0–52.0)
Hemoglobin: 12.5 g/dL — ABNORMAL LOW (ref 13.0–17.0)
Immature Granulocytes: 1 %
Lymphocytes Relative: 13 %
Lymphs Abs: 1.6 10*3/uL (ref 0.7–4.0)
MCH: 27.7 pg (ref 26.0–34.0)
MCHC: 31.8 g/dL (ref 30.0–36.0)
MCV: 86.9 fL (ref 80.0–100.0)
Monocytes Absolute: 1.4 10*3/uL — ABNORMAL HIGH (ref 0.1–1.0)
Monocytes Relative: 11 %
Neutro Abs: 9.8 10*3/uL — ABNORMAL HIGH (ref 1.7–7.7)
Neutrophils Relative %: 75 %
Platelets: 467 10*3/uL — ABNORMAL HIGH (ref 150–400)
RBC: 4.52 MIL/uL (ref 4.22–5.81)
RDW: 14.4 % (ref 11.5–15.5)
WBC: 13 10*3/uL — ABNORMAL HIGH (ref 4.0–10.5)
nRBC: 0 % (ref 0.0–0.2)

## 2019-10-29 LAB — FERRITIN: Ferritin: 1728 ng/mL — ABNORMAL HIGH (ref 24–336)

## 2019-10-29 LAB — PHOSPHORUS: Phosphorus: 4.7 mg/dL — ABNORMAL HIGH (ref 2.5–4.6)

## 2019-10-29 LAB — MAGNESIUM: Magnesium: 2.2 mg/dL (ref 1.7–2.4)

## 2019-10-29 LAB — D-DIMER, QUANTITATIVE: D-Dimer, Quant: 1.27 ug/mL-FEU — ABNORMAL HIGH (ref 0.00–0.50)

## 2019-10-29 LAB — C-REACTIVE PROTEIN: CRP: 15.3 mg/dL — ABNORMAL HIGH (ref <1.0)

## 2019-10-29 MED ORDER — DM-GUAIFENESIN ER 30-600 MG PO TB12
1.0000 | ORAL_TABLET | Freq: Two times a day (BID) | ORAL | Status: DC
  Administered 2019-10-29 – 2019-10-31 (×5): 1 via ORAL
  Filled 2019-10-29 (×5): qty 1

## 2019-10-29 MED ORDER — ALBUTEROL SULFATE HFA 108 (90 BASE) MCG/ACT IN AERS
1.0000 | INHALATION_SPRAY | RESPIRATORY_TRACT | Status: DC | PRN
  Administered 2019-10-29 (×2): 1 via RESPIRATORY_TRACT
  Filled 2019-10-29: qty 6.7

## 2019-10-29 MED ORDER — BENZONATATE 100 MG PO CAPS
100.0000 mg | ORAL_CAPSULE | Freq: Two times a day (BID) | ORAL | Status: DC | PRN
  Administered 2019-10-29: 100 mg via ORAL
  Filled 2019-10-29: qty 1

## 2019-10-29 NOTE — Progress Notes (Signed)
PROGRESS NOTE    Tom Wilson  [id number] DOB: 24-Dec-1970 DOA: 10/27/2019 PCP: Tom Squibb, MD      Brief Narrative:  Tom Wilson is a 49 y.o. M with no significant PMHx who presented with 1 week flu-like symptoms, myalgias, fever and now shortness of breath.  In the ER, CXR showed bilateral pneumonia.  Hypoxic requiring 2L.  COVID+.         Assessment & Plan:  Coronavirus pneumonitis with acute hypoxic respiratory failure Presented with myalgias, fever, pneumonia by  in the setting of the ongoing 2020-2021 COVID-19 pandemic.  SpO2 requirements stable, patient feeling still tired, maybe moderately better. -Continue remdesivir day 3 of 5 -Continue steroids -Continue VTE PPx with Lovenox -Continue Zinc and Vitamin C -Flutter valve, turn, cough, incentive spirometry q2hrs while awake    Transaminitis Due to COVID, improved -Trend LFTs  Leukocytosis Procal low, doubt bacterial pneumonia  BPH -Continue tamsulosin  ADD -Hold Ritalin         Disposition: Status is: Inpatient  Remains inpatient appropriate because:IV treatments appropriate due to intensity of illness or inability to take PO and Inpatient level of care appropriate due to severity of illness   Dispo: The patient is from: Home              Anticipated d/c is to: Home              Anticipated d/c date is: 3 days              Patient currently is not medically stable to d/c.              DVT prophylaxis: Lovenox Code Status: FULL Family Communication: Did not speak with wife yesterday, spoke with her this morning.  MDM:  The below labs and imaging reports reviewed and summarized above.  Medication management as above.        Procedures:     Antimicrobials:    4/22 CXR bilateral opacities  Culture data:   4/22 blood culture no growth      Subjective: Patient is hoarse, still tired and with severe cough, aches and malaise.  Mild fever overnight.  No new  confusion, hemoptysis, sputum, chest pain, leg swelling.      Objective: Vitals:   10/28/19 2112 10/29/19 0555 10/29/19 1159 10/29/19 1540  BP: 126/80 118/80  118/80  Pulse: 66 66  61  Resp: 18 18  (!) 24  Temp: 98.3 F (36.8 C) 98.4 F (36.9 C)  98.4 F (36.9 C)  TempSrc: Oral Oral  Oral  SpO2: 95% 92% 94% 94%  Weight:      Height:        Intake/Output Summary (Last 24 hours) at 10/29/2019 1553 Last data filed at 10/29/2019 1542 Gross per 24 hour  Intake 597.58 ml  Output --  Net 597.58 ml   Filed Weights   10/27/19 0423  Weight: 103.4 kg    Examination: General appearance: Adult male, well nourished, no acute distress, very tired.  Lying in bed.     HEENT: Anicteric, conjunctival pink, lids lashes normal.  No nasal forming, discharge, or epistaxis.  Lips moist, dentition normal, oropharynx moist, no oral lesions, hearing normal. Skin: Skin warm and dry, no jaundice, no suspicious rashes or lesions. Cardiac: Regular rate and rhythm, no murmurs, JVP normal, no lower extremity edema. Respiratory: Respiratory rate normal, somewhat tachypneic with exertion, lung sounds overall diminished, few scattered rhonchi wheezing. Abdomen: Soft and without tenderness palpation or  guarding.  No ascites or distention. MSK: Normal muscle bulk and tone, no effusion or deformity. Neuro: Awake and alert, extraocular movement intact, moves all extremities with generalized weakness, speech fluent. Psych: Sensorium intact and responding to questions, attention normal, affect normal, judgment insight appear normal.            Data Reviewed: I have personally reviewed following labs and imaging studies:  CBC: Recent Labs  Lab 10/27/19 0553 10/28/19 0156 10/29/19 0200  WBC 11.2* 11.6* 13.0*  NEUTROABS 9.7* 9.3* 9.8*  HGB 13.5 13.1 12.5*  HCT 40.5 39.9 39.3  MCV 85.6 84.7 86.9  PLT 271 381 0000000*   Basic Metabolic Panel: Recent Labs  Lab 10/27/19 0553 10/28/19 0156  10/29/19 0200  NA 137 137 138  K 3.6 3.9 4.4  CL 102 101 103  CO2 25 25 25   GLUCOSE 121* 146* 149*  BUN 23* 24* 29*  CREATININE 1.39* 1.11 0.95  CALCIUM 8.4* 8.8* 9.0  MG  --  2.1 2.2  PHOS  --  2.3* 4.7*   GFR: Estimated Creatinine Clearance: 113.8 mL/min (by C-G formula based on SCr of 0.95 mg/dL). Liver Function Tests: Recent Labs  Lab 10/27/19 0553 10/28/19 0156 10/29/19 0200  AST 52* 47* 40  ALT 53* 47* 44  ALKPHOS 68 69 63  BILITOT 0.9 0.5 0.9  PROT 6.9 6.7 6.4*  ALBUMIN 3.3* 2.9* 2.8*   No results for input(s): LIPASE, AMYLASE in the last 168 hours. No results for input(s): AMMONIA in the last 168 hours. Coagulation Profile: No results for input(s): INR, PROTIME in the last 168 hours. Cardiac Enzymes: No results for input(s): CKTOTAL, CKMB, CKMBINDEX, TROPONINI in the last 168 hours. BNP (last 3 results) No results for input(s): PROBNP in the last 8760 hours. HbA1C: No results for input(s): HGBA1C in the last 72 hours. CBG: No results for input(s): GLUCAP in the last 168 hours. Lipid Profile: Recent Labs    10/27/19 0553  TRIG 92   Thyroid Function Tests: No results for input(s): TSH, T4TOTAL, FREET4, T3FREE, THYROIDAB in the last 72 hours. Anemia Panel: Recent Labs    10/28/19 0156 10/29/19 0200  FERRITIN 2,475* 1,728*   Urine analysis:    Component Value Date/Time   LABSPEC >=1.030 06/13/2019 2024   PHURINE 6.0 06/13/2019 2024   GLUCOSEU NEGATIVE 06/13/2019 2024   HGBUR MODERATE (A) 06/13/2019 2024   BILIRUBINUR NEGATIVE 06/13/2019 2024   KETONESUR NEGATIVE 06/13/2019 2024   PROTEINUR NEGATIVE 06/13/2019 2024   UROBILINOGEN 0.2 06/13/2019 2024   NITRITE NEGATIVE 06/13/2019 2024   LEUKOCYTESUR NEGATIVE 06/13/2019 2024   Sepsis Labs: @LABRCNTIP (procalcitonin:4,lacticacidven:4)  ) Recent Results (from the past 240 hour(s))  Blood Culture (routine x 2)     Status: None (Preliminary result)   Collection Time: 10/27/19  5:53 AM    Specimen: BLOOD  Result Value Ref Range Status   Specimen Description   Final    BLOOD RIGHT ANTECUBITAL Performed at [facility], [place] [address], [place], [place] [address]    Special Requests   Final    BOTTLES DRAWN AEROBIC AND ANAEROBIC Blood Culture adequate volume Performed at [facility] [address], [place], [place] [address]    Culture   Final    NO GROWTH 2 DAYS Performed at [facility] [address], [place], [place] [address]    Report Status PENDING  Incomplete  Blood Culture (routine x 2)     Status: None (Preliminary result)  Collection Time: 10/27/19  5:53 AM   Specimen: BLOOD RIGHT HAND  Result Value Ref Range Status   Specimen Description   Final    BLOOD RIGHT HAND Performed at [facility] [address] [place], [place] [address]    Special Requests   Final    BOTTLES DRAWN AEROBIC AND ANAEROBIC Blood Culture adequate volume Performed at [facility] [address] [place], [place] [address]    Culture   Final    NO GROWTH 2 DAYS Performed at [facility] [address], [place], [place] [address]    Report Status PENDING  Incomplete  Respiratory Panel by RT PCR (Flu A&B, Covid) - Nasopharyngeal Swab     Status: Abnormal   Collection Time: 10/27/19  6:56 AM   Specimen: Nasopharyngeal Swab  Result Value Ref Range Status   SARS Coronavirus 2 by RT PCR POSITIVE (A) NEGATIVE Final    Comment: RESULT CALLED TO, READ BACK BY AND VERIFIED WITH: MOORE,C. RN AT XT:[id number] 10/27/19 MULLINS,T (NOTE) SARS-CoV-2 target nucleic acids are DETECTED. SARS-CoV-2 RNA is generally detectable in upper respiratory specimens  during the acute phase of infection. Positive results are indicative of the presence of the identified virus, but do not rule out bacterial infection or co-infection with other pathogens not detected by the test. Clinical correlation with patient history  and other diagnostic information is necessary to determine patient infection status. The expected result is Negative. Fact Sheet for Patients:  PinkCheek.be Fact Sheet for Healthcare Providers: GravelBags.it This test is not yet approved or cleared by the [place] FDA and  has been authorized for detection and/or diagnosis of SARS-CoV-2 by FDA under an Emergency Use Authorization (EUA).  This EUA will remain in effect (meaning this test can be used ) for the duration of  the COVID-19 declaration under Section 564(b)(1) of the Act, 21 U.S.C. section 360bbb-3(b)(1), unless the authorization is terminated or revoked sooner.    Influenza A by PCR NEGATIVE NEGATIVE Final   Influenza B by PCR NEGATIVE NEGATIVE Final    Comment: (NOTE) The Xpert Xpress SARS-CoV-2/FLU/RSV assay is intended as an aid in  the diagnosis of influenza from Nasopharyngeal swab specimens and  should not be used as a sole basis for treatment. Nasal washings and  aspirates are unacceptable for Xpert Xpress SARS-CoV-2/FLU/RSV  testing. Fact Sheet for Patients: PinkCheek.be Fact Sheet for Healthcare Providers: GravelBags.it This test is not yet approved or cleared by the [place] FDA and  has been authorized for detection and/or diagnosis of SARS-CoV-2 by  FDA under an Emergency Use Authorization (EUA). This EUA will remain  in effect (meaning this test can be used) for the duration of the  Covid-19 declaration under Section 564(b)(1) of the Act, 21  U.S.C. section 360bbb-3(b)(1), unless the authorization is  terminated or revoked. Performed at [facility], [place] [address], [place], [place] [address]          Radiology Studies: No results found.      Scheduled Meds: . vitamin C  500 mg Oral Daily  . dexamethasone  6 mg Oral Q24H  .  dextromethorphan-guaiFENesin  1 tablet Oral BID  . enoxaparin (LOVENOX) injection  0.5 mg/kg Subcutaneous Q24H  . feeding supplement (ENSURE ENLIVE)  237 mL Oral TID BM  . tamsulosin  0.4 mg Oral QPC supper  . zinc sulfate  220 mg Oral Daily   Continuous Infusions: . sodium chloride 10 mL/hr at 10/28/19  1100  . remdesivir 100 mg in NS 100 mL 100 mg (10/29/19 1144)     LOS: 2 days    Time spent: 5 minutes      Edwin Dada, MD [facility] 10/29/2019, 3:53 PM     Please page through [place]:  [email or web address] Contact charge nurse for password If 7PM-7AM, please contact night-coverage

## 2019-10-30 DIAGNOSIS — J1282 Pneumonia due to coronavirus disease 2019: Secondary | ICD-10-CM

## 2019-10-30 DIAGNOSIS — U071 COVID-19: Principal | ICD-10-CM

## 2019-10-30 LAB — COMPREHENSIVE METABOLIC PANEL
ALT: 43 U/L (ref 0–44)
AST: 32 U/L (ref 15–41)
Albumin: 2.9 g/dL — ABNORMAL LOW (ref 3.5–5.0)
Alkaline Phosphatase: 56 U/L (ref 38–126)
Anion gap: 11 (ref 5–15)
BUN: 26 mg/dL — ABNORMAL HIGH (ref 6–20)
CO2: 27 mmol/L (ref 22–32)
Calcium: 9 mg/dL (ref 8.9–10.3)
Chloride: 106 mmol/L (ref 98–111)
Creatinine, Ser: 0.81 mg/dL (ref 0.61–1.24)
GFR calc Af Amer: >60 mL/min (ref >60)
GFR calc non Af Amer: >60 mL/min (ref >60)
Glucose, Bld: 136 mg/dL — ABNORMAL HIGH (ref 70–99)
Potassium: 4.6 mmol/L (ref 3.5–5.1)
Sodium: 144 mmol/L (ref 135–145)
Total Bilirubin: 0.7 mg/dL (ref 0.3–1.2)
Total Protein: 6.2 g/dL — ABNORMAL LOW (ref 6.5–8.1)

## 2019-10-30 LAB — CBC WITH DIFFERENTIAL/PLATELET
Abs Immature Granulocytes: 0.15 10*3/uL — ABNORMAL HIGH (ref 0.00–0.07)
Basophils Absolute: 0.1 10*3/uL (ref 0.0–0.1)
Basophils Relative: 1 %
Eosinophils Absolute: 0 10*3/uL (ref 0.0–0.5)
Eosinophils Relative: 0 %
HCT: 37.6 % — ABNORMAL LOW (ref 39.0–52.0)
Hemoglobin: 12.1 g/dL — ABNORMAL LOW (ref 13.0–17.0)
Immature Granulocytes: 1 %
Lymphocytes Relative: 19 %
Lymphs Abs: 2.4 10*3/uL (ref 0.7–4.0)
MCH: 28.2 pg (ref 26.0–34.0)
MCHC: 32.2 g/dL (ref 30.0–36.0)
MCV: 87.6 fL (ref 80.0–100.0)
Monocytes Absolute: 1.4 10*3/uL — ABNORMAL HIGH (ref 0.1–1.0)
Monocytes Relative: 11 %
Neutro Abs: 8.6 10*3/uL — ABNORMAL HIGH (ref 1.7–7.7)
Neutrophils Relative %: 68 %
Platelets: 602 10*3/uL — ABNORMAL HIGH (ref 150–400)
RBC: 4.29 MIL/uL (ref 4.22–5.81)
RDW: 14.4 % (ref 11.5–15.5)
WBC: 12.6 10*3/uL — ABNORMAL HIGH (ref 4.0–10.5)
nRBC: 0 % (ref 0.0–0.2)

## 2019-10-30 LAB — D-DIMER, QUANTITATIVE: D-Dimer, Quant: 1.01 ug/mL-FEU — ABNORMAL HIGH (ref 0.00–0.50)

## 2019-10-30 LAB — MAGNESIUM: Magnesium: 2.3 mg/dL (ref 1.7–2.4)

## 2019-10-30 LAB — FERRITIN: Ferritin: 1066 ng/mL — ABNORMAL HIGH (ref 24–336)

## 2019-10-30 LAB — PHOSPHORUS: Phosphorus: 5.3 mg/dL — ABNORMAL HIGH (ref 2.5–4.6)

## 2019-10-30 LAB — C-REACTIVE PROTEIN: CRP: 7.3 mg/dL — ABNORMAL HIGH (ref <1.0)

## 2019-10-30 MED ORDER — PHENOL 1.4 % MT LIQD
1.0000 | OROMUCOSAL | Status: DC | PRN
  Administered 2019-10-30: 1 via OROMUCOSAL
  Filled 2019-10-30: qty 177

## 2019-10-30 NOTE — Progress Notes (Signed)
PROGRESS NOTE  Tom Wilson [id number] DOB: [birth year] DOA: 10/27/2019 PCP: Celene Squibb, MD  Brief History   Tom Wilson is a 49 y.o. M with no significant PMHx who presented with 1 week flu-like symptoms, myalgias, fever and now shortness of breath. In the ED he was found to have bilateral multifocal pneumonia on CXR. He was hypoxic and was found to by positive for COVID-19.   [facility] were consulted to admit the patient for further evaluation and treatment.  He has been admitted to a telemetry bed. He is receiving. IV remdesevir, IV decadron, beta agonists, zinc, and Vitamin C.   Consultants  . None  Procedures  . None  Antibiotics   Anti-infectives (From admission, onward)   Start     Dose/Rate Route Frequency Ordered Stop   10/28/19 1000  remdesivir 100 mg in sodium chloride 0.9 % 100 mL IVPB     100 mg 200 mL/hr over 30 Minutes Intravenous Daily 10/27/19 0739 11/01/19 0959   10/27/19 0800  remdesivir 100 mg in sodium chloride 0.9 % 100 mL IVPB     100 mg 200 mL/hr over 30 Minutes Intravenous Every hour 10/27/19 0739 10/27/19 1013    .  Subjective  The patient is resting in bed. His voice is very hoarse. He is also complaining of his cough. He is also wheezing.  Objective   Vitals:  Vitals:   10/30/19 1400 10/30/19 1552  BP:  127/77  Pulse:  (!) 56  Resp:  19  Temp:  (!) 97.5 F (36.4 C)  SpO2: 93% 91%   Exam:  Constitutional:  . The patient is awake, alert, and oriented x 3. No acute distress. Respiratory:  . No increased work of breathing. . No rales or rhonchi . No tactile fremitus . Positive for wheezes throughout.  Cardiovascular:  . Regular rate and rhythm . No murmurs, ectopy, or gallups. . No lateral PMI. No thrills. Abdomen:  . Abdomen is soft, non-tender, non-distended . No hernias, masses, or organomegaly . Normoactive bowel sounds.  Musculoskeletal:  . No cyanosis, clubbing, or edema Skin:  . No rashes, lesions,  ulcers . palpation of skin: no induration or nodules Neurologic:  . CN 2-12 intact . Sensation all 4 extremities intact Psychiatric:  . Mental status o Mood, affect appropriate o Orientation to person, place, time  . judgment and insight appear intact  I have personally reviewed the following:   Today's Data  . Vitals, CMP, CBC, Ferritin, CRP, D Dimer  Micro Data  . Blood culture x 2 have had no growth.  Imaging  . CXR patchy peripheral airspace disease bilaterally left greater than right. Findins are consistent with viral pneumonia. There was also mild vascular congestion.   Cardiology Data  . EKG  Scheduled Meds: . vitamin C  500 mg Oral Daily  . dexamethasone  6 mg Oral Q24H  . dextromethorphan-guaiFENesin  1 tablet Oral BID  . enoxaparin (LOVENOX) injection  0.5 mg/kg Subcutaneous Q24H  . feeding supplement (ENSURE ENLIVE)  237 mL Oral TID BM  . tamsulosin  0.4 mg Oral QPC supper  . zinc sulfate  220 mg Oral Daily   Continuous Infusions: . sodium chloride 10 mL/hr at 10/28/19 1100  . remdesivir 100 mg in NS 100 mL 100 mg (10/30/19 0843)    Principal Problem:   Pneumonia due to COVID-19 virus Active Problems:   Hypoxia   LOS: 3 days   A & P   Coronavirus pneumonitis with  acute hypoxic respiratory failure Presented with myalgias, fever, pneumonia by  in the setting of the ongoing 2020-2021 COVID-19 pandemic. The patient was admitted to a telemetry bed. She is continued on IV Remdesevir, IV steroids, beta agonists, lovenox, and zinc and Vitamin C. He is also to use a flutter valve and to perform a turn, cough, and incentive spirometry. He is saturating in the low 90's on 1 liter O2.  Transaminitis: Due to COVID, improving. Continue to monitor.  Leukocytosis: No evidence for bacterial pneumonia with low procal.   BPH: Continue tamsulosin  ADD: Hold Ritalin  I have seen and examined this patient myself. I have spent 34 minutes in his evaluation and  care.  DVT prophylaxis: Lovenox Code Status: FULL Family Communication: None available. Disposition: The patient was admitted as inpatient from home. It is anticipated that he will discharge to home. Barriers to discharge include continuing need for IV steroids and remdesevir. He is not medically cleared for discharge.  Shilynn Hoch, DO [facility] Direct contact: see [email or web address]  7PM-7AM contact night coverage as above 10/30/2019, 5:25 PM  LOS: 3 days

## 2019-10-31 LAB — CBC WITH DIFFERENTIAL/PLATELET
Abs Immature Granulocytes: 0.25 10*3/uL — ABNORMAL HIGH (ref 0.00–0.07)
Basophils Absolute: 0.1 10*3/uL (ref 0.0–0.1)
Basophils Relative: 1 %
Eosinophils Absolute: 0 10*3/uL (ref 0.0–0.5)
Eosinophils Relative: 0 %
HCT: 38.6 % — ABNORMAL LOW (ref 39.0–52.0)
Hemoglobin: 12.3 g/dL — ABNORMAL LOW (ref 13.0–17.0)
Immature Granulocytes: 2 %
Lymphocytes Relative: 19 %
Lymphs Abs: 2.8 10*3/uL (ref 0.7–4.0)
MCH: 27.8 pg (ref 26.0–34.0)
MCHC: 31.9 g/dL (ref 30.0–36.0)
MCV: 87.1 fL (ref 80.0–100.0)
Monocytes Absolute: 1.7 10*3/uL — ABNORMAL HIGH (ref 0.1–1.0)
Monocytes Relative: 11 %
Neutro Abs: 9.8 10*3/uL — ABNORMAL HIGH (ref 1.7–7.7)
Neutrophils Relative %: 67 %
Platelets: 693 10*3/uL — ABNORMAL HIGH (ref 150–400)
RBC: 4.43 MIL/uL (ref 4.22–5.81)
RDW: 14 % (ref 11.5–15.5)
WBC: 14.6 10*3/uL — ABNORMAL HIGH (ref 4.0–10.5)
nRBC: 0 % (ref 0.0–0.2)

## 2019-10-31 LAB — D-DIMER, QUANTITATIVE: D-Dimer, Quant: 1.06 ug/mL-FEU — ABNORMAL HIGH (ref 0.00–0.50)

## 2019-10-31 LAB — PHOSPHORUS: Phosphorus: 4.4 mg/dL (ref 2.5–4.6)

## 2019-10-31 LAB — COMPREHENSIVE METABOLIC PANEL
ALT: 53 U/L — ABNORMAL HIGH (ref 0–44)
AST: 36 U/L (ref 15–41)
Albumin: 2.8 g/dL — ABNORMAL LOW (ref 3.5–5.0)
Alkaline Phosphatase: 53 U/L (ref 38–126)
Anion gap: 9 (ref 5–15)
BUN: 26 mg/dL — ABNORMAL HIGH (ref 6–20)
CO2: 27 mmol/L (ref 22–32)
Calcium: 8.9 mg/dL (ref 8.9–10.3)
Chloride: 105 mmol/L (ref 98–111)
Creatinine, Ser: 0.92 mg/dL (ref 0.61–1.24)
GFR calc Af Amer: >60 mL/min (ref >60)
GFR calc non Af Amer: >60 mL/min (ref >60)
Glucose, Bld: 120 mg/dL — ABNORMAL HIGH (ref 70–99)
Potassium: 4.5 mmol/L (ref 3.5–5.1)
Sodium: 141 mmol/L (ref 135–145)
Total Bilirubin: 0.9 mg/dL (ref 0.3–1.2)
Total Protein: 5.9 g/dL — ABNORMAL LOW (ref 6.5–8.1)

## 2019-10-31 LAB — FERRITIN: Ferritin: 944 ng/mL — ABNORMAL HIGH (ref 24–336)

## 2019-10-31 LAB — MAGNESIUM: Magnesium: 2.2 mg/dL (ref 1.7–2.4)

## 2019-10-31 LAB — C-REACTIVE PROTEIN: CRP: 4 mg/dL — ABNORMAL HIGH (ref <1.0)

## 2019-10-31 MED ORDER — ASCORBIC ACID 500 MG PO TABS: 500.0000 mg | ORAL_TABLET | Freq: Every day | ORAL | 0 refills | Status: AC

## 2019-10-31 MED ORDER — PHENOL 1.4 % MT LIQD: 1.0000 | OROMUCOSAL | 0 refills | Status: DC | PRN

## 2019-10-31 MED ORDER — ZINC SULFATE 220 (50 ZN) MG PO CAPS: 220.0000 mg | ORAL_CAPSULE | Freq: Every day | ORAL | 0 refills | Status: AC

## 2019-10-31 MED ORDER — HYDROCOD POLST-CPM POLST ER 10-8 MG/5ML PO SUER: 5.0000 mL | Freq: Two times a day (BID) | ORAL | 0 refills | Status: DC | PRN

## 2019-10-31 MED ORDER — ENSURE ENLIVE PO LIQD: 237.0000 mL | Freq: Three times a day (TID) | ORAL | 12 refills | Status: DC

## 2019-10-31 MED ORDER — ALBUTEROL SULFATE HFA 108 (90 BASE) MCG/ACT IN AERS: 1.0000 | INHALATION_SPRAY | RESPIRATORY_TRACT | 0 refills | Status: DC | PRN

## 2019-10-31 MED ORDER — DEXAMETHASONE 2 MG PO TABS: ORAL_TABLET | ORAL | 0 refills | Status: AC

## 2019-10-31 MED ORDER — MUCINEX 600 MG PO TB12: 1200.0000 mg | ORAL_TABLET | Freq: Two times a day (BID) | ORAL | 2 refills | Status: AC

## 2019-10-31 MED FILL — DEXAMETHASONE 2 MG TABLET: 2 | 15 days supply | Qty: 29 | Fill #0

## 2019-10-31 MED FILL — ALBUTEROL SULFATE HFA 108 (: 108 (90 BAS | 34 days supply | Qty: 18 | Fill #0

## 2019-10-31 NOTE — TOC Progression Note (Signed)
Transition of Care [facility]) - Progression Note    Patient Details  Name: Tom Wilson MRN: [id number] Date of Birth: 1970-09-02  Transition of Care [facility] CM/SW Contact  Purcell Mouton, RN Phone Number: 10/31/2019, 2:25 PM  Clinical Narrative:     Pt will discharge home with Remote Health and no other needs at this present time.        Expected Discharge Plan and Services           Expected Discharge Date: 10/31/19                                     Social Determinants of Health (SDOH) Interventions    Readmission Risk Interventions No flowsheet data found.

## 2019-11-01 LAB — CULTURE, BLOOD (ROUTINE X 2)
Culture: NO GROWTH
Culture: NO GROWTH
Special Requests: ADEQUATE
Special Requests: ADEQUATE

## 2019-11-01 MED FILL — HYDROCODONE-CHLORPHEN ER SU: 10-8 | 12 days supply | Qty: 115 | Fill #0

## 2019-11-01 NOTE — Discharge Summary (Signed)
Physician Discharge Summary  Tom Wilson [id number] DOB: [birth year] DOA: 10/27/2019  PCP: Celene Squibb, MD  Admit date: 10/27/2019 Discharge date: 11/01/2019  Recommendations for Outpatient Follow-up:  1. Follow up with PCP in 7-10 days. 2. Remain in isolation through 11/17/2019.  Discharge Diagnoses: Principal diagnosis is #1 1. Coronavirus pneumonitis 2. Transaminitis 3. BPH 4. ADD  Discharge Condition: Fair  Disposition: Home  Diet recommendation: Regular  Filed Weights   10/27/19 0423  Weight: 103.4 kg   History of present illness:  Tom Wilson is a 49 y.o. male with past medical history of attention deficit disorder presented with worsening shortness of breath.  Patient states that he has been having cold/flulike symptoms for the last 10 to 11 days with myalgia, decreased appetite, intermittent fevers and chills.  He was diagnosed with COVID-19 apparently 5 days ago.  He has been having mostly dry cough with worsening shortness of breath over the last 2 days.  He currently feels short of breath even with minimal exertion.  Complains of some chest pain with excessive coughing.  No loss of consciousness, seizures, abdominal pain, diarrhea, dysuria, rash.  Lives with wife and daughter and states that they are both doing well.  ED Course: He was found to be hypoxic requiring supplemental oxygen and chest x-ray showed patchy peripheral airspace disease bilaterally.  He was started on Decadron and remdesivir as ordered.  Hospitalist service was called to evaluate the patient  Hospital Course:  He was admitted to a telemetry bed. He is receiving IV remdesevir, IV decadron, beta agonists, zinc, and Vitamin C.   The patient's respiratory status gradually improved. He completed his course of remdesivir. He will be discharged to home on a tapering dose of steroids.   Today's assessment: S: The patient is resting comfortably. No new complaints.  O: Vitals:  Vitals:   10/31/19 0433 10/31/19 1227  BP: 112/71   Pulse: (!) 51 86  Resp:    Temp: 98.4 F (36.9 C)   SpO2: 91% 94%   Exam:  Constitutional:   The patient is awake, alert, and oriented x 3. No acute distress. Respiratory:   No increased work of breathing.  No wheezes, rales, or rhonchi  No tactile fremitus Cardiovascular:   Regular rate and rhythm  No murmurs, ectopy, or gallups.  No lateral PMI. No thrills. Abdomen:   Abdomen is soft, non-tender, non-distended  No hernias, masses, or organomegaly  Normoactive bowel sounds.  Musculoskeletal:   No cyanosis, clubbing, or edema Skin:   No rashes, lesions, ulcers  palpation of skin: no induration or nodules Neurologic:   CN 2-12 intact  Sensation all 4 extremities intact Psychiatric:   Mental status ? Mood, affect appropriate ? Orientation to person, place, time   judgment and insight appear intact  Discharge Instructions  Discharge Instructions    Activity as tolerated - No restrictions   Complete by: As directed    Call MD for:   Complete by: As directed    Chest pain, neurological changes, worsening shortness of breath.   Call MD for:  difficulty breathing, headache or visual disturbances   Complete by: As directed    Diet - low sodium heart healthy   Complete by: As directed    Discharge instructions   Complete by: As directed    Follow up with PCP in 7-10 days. Remain in isolation through 11/17/2019.   Increase activity slowly   Complete by: As directed      Allergies as  of 10/31/2019   No Known Allergies     Medication List    TAKE these medications   acetaminophen 500 MG tablet Commonly known as: TYLENOL Take 1,000 mg by mouth every 6 (six) hours as needed for mild pain or headache.   albuterol 108 (90 Base) MCG/ACT inhaler Commonly known as: VENTOLIN HFA Inhale 1 puff into the lungs every 4 (four) hours as needed for wheezing or shortness of breath.   ascorbic acid 500 MG  tablet Commonly known as: VITAMIN C Take 1 tablet (500 mg total) by mouth daily.   aspirin 81 MG chewable tablet Chew 81 mg by mouth daily.   chlorpheniramine-HYDROcodone 10-8 MG/5ML Suer Commonly known as: TUSSIONEX Take 5 mLs by mouth every 12 (twelve) hours as needed for cough.   dexamethasone 2 MG tablet Commonly known as: DECADRON Take 3 tablets (6 mg total) by mouth daily for 6 days, THEN 2 tablets (4 mg total) daily for 3 days, THEN 1 tablet (2 mg total) daily for 3 days, THEN 0.5 tablets (1 mg total) daily for 3 days. Start taking on: November 01, 2019   feeding supplement (ENSURE ENLIVE) Liqd Take 237 mLs by mouth 3 (three) times daily between meals.   HM Vitamin D3 100 MCG (4000 UT) Caps Generic drug: Cholecalciferol Take 4,000 Units by mouth daily.   Mucinex 600 MG 12 hr tablet Generic drug: guaiFENesin Take 2 tablets (1,200 mg total) by mouth 2 (two) times daily.   Mydayis 50 MG Cp24 Generic drug: Amphet-Dextroamphet 3-Bead ER Take 50 mg by mouth every morning.   phenol 1.4 % Liqd Commonly known as: CHLORASEPTIC Use as directed 1 spray in the mouth or throat as needed for throat irritation / pain.   tamsulosin 0.4 MG Caps capsule Commonly known as: Flomax Take 1 capsule (0.4 mg total) by mouth daily.   VITAMIN E PO Take 1 tablet by mouth daily.   zinc sulfate 220 (50 Zn) MG capsule Take 1 capsule (220 mg total) by mouth daily.      No Known Allergies  The results of significant diagnostics from this hospitalization (including imaging, microbiology, ancillary and laboratory) are listed below for reference.    Significant Diagnostic Studies: DG Chest Port 1 View  Result Date: 10/27/2019 CLINICAL DATA:  Shortness of breath. COVID positive. EXAM: PORTABLE CHEST 1 VIEW COMPARISON:  None. FINDINGS: Heart size is exaggerated by low lung volumes. Patchy peripheral airspace opacities are present bilaterally, left greater than right. Mild pulmonary vascular  congestion is present. The visualized soft tissues and bony thorax are unremarkable. IMPRESSION: 1. Patchy peripheral airspace disease bilaterally, left greater than right. The findings are consistent with viral pneumonia. 2. Mild pulmonary vascular congestion. 3. Low lung volumes. Electronically Signed   By: San Morelle M.D.   On: 10/27/2019 05:06    Microbiology: Recent Results (from the past 240 hour(s))  Blood Culture (routine x 2)     Status: None   Collection Time: 10/27/19  5:53 AM   Specimen: BLOOD  Result Value Ref Range Status   Specimen Description   Final    BLOOD RIGHT ANTECUBITAL Performed at [facility] [address], [place], [place] [address]    Special Requests   Final    BOTTLES DRAWN AEROBIC AND ANAEROBIC Blood Culture adequate volume Performed at [facility] [address] [place], [place] [address]    Culture   Final    NO GROWTH 5 DAYS Performed at [facility], [place] [address] [place], [place] [address]    Report Status 11/01/2019 FINAL  Final  Blood Culture (routine x 2)     Status: None   Collection Time: 10/27/19  5:53 AM   Specimen: BLOOD RIGHT HAND  Result Value Ref Range Status   Specimen Description   Final    BLOOD RIGHT HAND Performed at [facility] [address] [place], [place] [address]    Special Requests   Final    BOTTLES DRAWN AEROBIC AND ANAEROBIC Blood Culture adequate volume Performed at [facility] [address], [place], [place] [address]    Culture   Final    NO GROWTH 5 DAYS Performed at [facility], [place] [address] [place], [place] [address]    Report Status 11/01/2019 FINAL  Final  Respiratory Panel by RT PCR (Flu A&B, Covid) - Nasopharyngeal Swab     Status: Abnormal   Collection Time: 10/27/19  6:56 AM   Specimen: Nasopharyngeal Swab  Result Value Ref Range Status   SARS Coronavirus 2 by RT PCR POSITIVE (A)  NEGATIVE Final    Comment: RESULT CALLED TO, READ BACK BY AND VERIFIED WITH: MOORE,C. RN AT [id number] 10/27/19 MULLINS,T (NOTE) SARS-CoV-2 target nucleic acids are DETECTED. SARS-CoV-2 RNA is generally detectable in upper respiratory specimens  during the acute phase of infection. Positive results are indicative of the presence of the identified virus, but do not rule out bacterial infection or co-infection with other pathogens not detected by the test. Clinical correlation with patient history and other diagnostic information is necessary to determine patient infection status. The expected result is Negative. Fact Sheet for Patients:  PinkCheek.be Fact Sheet for Healthcare Providers: GravelBags.it This test is not yet approved or cleared by the [place] FDA and  has been authorized for detection and/or diagnosis of SARS-CoV-2 by FDA under an Emergency Use Authorization (EUA).  This EUA will remain in effect (meaning this test can be used ) for the duration of  the COVID-19 declaration under Section 564(b)(1) of the Act, 21 U.S.C. section 360bbb-3(b)(1), unless the authorization is terminated or revoked sooner.    Influenza A by PCR NEGATIVE NEGATIVE Final   Influenza B by PCR NEGATIVE NEGATIVE Final    Comment: (NOTE) The Xpert Xpress SARS-CoV-2/FLU/RSV assay is intended as an aid in  the diagnosis of influenza from Nasopharyngeal swab specimens and  should not be used as a sole basis for treatment. Nasal washings and  aspirates are unacceptable for Xpert Xpress SARS-CoV-2/FLU/RSV  testing. Fact Sheet for Patients: PinkCheek.be Fact Sheet for Healthcare Providers: GravelBags.it This test is not yet approved or cleared by the [place] FDA and  has been authorized for detection and/or diagnosis of SARS-CoV-2 by  FDA under an Emergency Use Authorization (EUA).  This EUA will remain  in effect (meaning this test can be used) for the duration of the  Covid-19 declaration under Section 564(b)(1) of the Act, 21  U.S.C. section 360bbb-3(b)(1), unless the authorization is  terminated or revoked. Performed at [facility], [place] [address] [place], [place] [address]      Labs: Basic Metabolic Panel: Recent Labs  Lab 10/27/19 0553 10/28/19 0156 10/29/19 0200 10/30/19 0427 10/31/19 0333  NA 137 137 138 144 141  K 3.6 3.9 4.4 4.6 4.5  CL 102 101 103 106 105  CO2 25 25 25 27 27   GLUCOSE 121* 146* 149* 136* 120*  BUN 23* 24* 29*  26* 26*  CREATININE 1.39* 1.11 0.95 0.81 0.92  CALCIUM 8.4* 8.8* 9.0 9.0 8.9  MG  --  2.1 2.2 2.3 2.2  PHOS  --  2.3* 4.7* 5.3* 4.4   Liver Function Tests: Recent Labs  Lab 10/27/19 0553 10/28/19 0156 10/29/19 0200 10/30/19 0427 10/31/19 0333  AST 52* 47* 40 32 36  ALT 53* 47* 44 43 53*  ALKPHOS 68 69 63 56 53  BILITOT 0.9 0.5 0.9 0.7 0.9  PROT 6.9 6.7 6.4* 6.2* 5.9*  ALBUMIN 3.3* 2.9* 2.8* 2.9* 2.8*   No results for input(s): LIPASE, AMYLASE in the last 168 hours. No results for input(s): AMMONIA in the last 168 hours. CBC: Recent Labs  Lab 10/27/19 0553 10/28/19 0156 10/29/19 0200 10/30/19 0427 10/31/19 0333  WBC 11.2* 11.6* 13.0* 12.6* 14.6*  NEUTROABS 9.7* 9.3* 9.8* 8.6* 9.8*  HGB 13.5 13.1 12.5* 12.1* 12.3*  HCT 40.5 39.9 39.3 37.6* 38.6*  MCV 85.6 84.7 86.9 87.6 87.1  PLT 271 381 467* 602* 693*   Cardiac Enzymes: No results for input(s): CKTOTAL, CKMB, CKMBINDEX, TROPONINI in the last 168 hours. BNP: BNP (last 3 results) No results for input(s): BNP in the last 8760 hours.  ProBNP (last 3 results) No results for input(s): PROBNP in the last 8760 hours.  CBG: No results for input(s): GLUCAP in the last 168 hours.  Principal Problem:   Pneumonia due to COVID-19 virus Active Problems:   Hypoxia  Time coordinating discharge: 38 minutes  Signed:        Sayer Masini, DO [facility]  11/01/2019, 7:51 AM

## 2019-11-02 ENCOUNTER — Other Ambulatory Visit: Payer: Self-pay | Admitting: *Deleted

## 2019-11-02 NOTE — Patient Outreach (Signed)
[facility]) Care Management  11/02/2019  Tom Wilson [birth year] [id number]   Transition of care telephone call  Referral received:10/28/19 Initial outreach:11/02/19 Insurance: [facility]   Initial unsuccessful telephone call to patient's preferred number in order to complete transition of care assessment; no answer, left HIPAA compliant voicemail message requesting return call.   Objective:  Tom Wilson  was hospitalized at [facility] from 4/22-4/26/21 for Pneumonia due to Covid 19 virus  Comorbidities include:  He/She was discharged to home on //21without the need for home health services or DME.  Plan:  This RNCM will route unsuccessful outreach letter with [place] Management pamphlet and 24 hour Nurse Advice Line Magnet to [facility] Management clinical pool to be mailed to patient's home address. This RNCM will attempt another outreach within 4 business days.  Joylene Draft, RN, BSN  [facility] Management Coordinator  [phone]- Mobile [phone]- Toll Free Main Office

## 2019-11-07 ENCOUNTER — Other Ambulatory Visit: Payer: Self-pay | Admitting: *Deleted

## 2019-11-07 NOTE — Patient Outreach (Signed)
[facility]) Care Management  11/07/2019  Hanzel Magrini [birth year] [id number]   Transition of care call Referral received: 10/28/19 Initial outreach attempt: 11/02/19 Insurance: [employer]    2nd unsuccessful telephone call to patient's preferred contact number in order to complete post hospital discharge transition of care assessment , no answer left HIPAA compliant message requesting return call.    Objective: Mr.Vanvorst was hospitalized [facility] from 4/22-4/26/21 for Pneumonia due to Covid 19 virus . History  includes: Attention deficit disorder  He was discharged to home on 11/01/19 , he was discharged home with Remote health services .      Plan If no return call from patient will attempt 3rd outreach in the next 4 business days.    Joylene Draft, RN, BSN  [place] Management Coordinator  [phone]- Mobile [phone]- Toll Free Main Office

## 2019-11-09 DIAGNOSIS — L989 Disorder of the skin and subcutaneous tissue, unspecified: Secondary | ICD-10-CM | POA: Diagnosis not present

## 2019-11-09 DIAGNOSIS — Z6832 Body mass index (BMI) 32.0-32.9, adult: Secondary | ICD-10-CM | POA: Diagnosis not present

## 2019-11-09 DIAGNOSIS — R7401 Elevation of levels of liver transaminase levels: Secondary | ICD-10-CM | POA: Diagnosis not present

## 2019-11-09 DIAGNOSIS — L309 Dermatitis, unspecified: Secondary | ICD-10-CM | POA: Diagnosis not present

## 2019-11-09 DIAGNOSIS — J029 Acute pharyngitis, unspecified: Secondary | ICD-10-CM | POA: Diagnosis not present

## 2019-11-09 DIAGNOSIS — J069 Acute upper respiratory infection, unspecified: Secondary | ICD-10-CM | POA: Diagnosis not present

## 2019-11-09 DIAGNOSIS — H9203 Otalgia, bilateral: Secondary | ICD-10-CM | POA: Diagnosis not present

## 2019-11-09 DIAGNOSIS — M66872 Spontaneous rupture of other tendons, left ankle and foot: Secondary | ICD-10-CM | POA: Diagnosis not present

## 2019-11-09 DIAGNOSIS — Z8616 Personal history of COVID-19: Secondary | ICD-10-CM | POA: Diagnosis not present

## 2019-11-09 DIAGNOSIS — D509 Iron deficiency anemia, unspecified: Secondary | ICD-10-CM | POA: Diagnosis not present

## 2019-11-09 DIAGNOSIS — F909 Attention-deficit hyperactivity disorder, unspecified type: Secondary | ICD-10-CM | POA: Diagnosis not present

## 2019-11-09 DIAGNOSIS — J1282 Pneumonia due to coronavirus disease 2019: Secondary | ICD-10-CM | POA: Diagnosis not present

## 2019-11-09 DIAGNOSIS — Z0000 Encounter for general adult medical examination without abnormal findings: Secondary | ICD-10-CM | POA: Diagnosis not present

## 2019-11-11 ENCOUNTER — Encounter: Payer: Self-pay | Admitting: *Deleted

## 2019-11-11 ENCOUNTER — Other Ambulatory Visit: Payer: Self-pay | Admitting: *Deleted

## 2019-11-11 NOTE — Patient Outreach (Addendum)
[facility]) Care Management  11/11/2019  Tom Wilson January 17, 1971 [id number]   Transition of care call/case closure   Referral received:10/28/19 Initial outreach:11/02/19 Insurance: [place]: 3rd attempt  successful telephone call to patient's preferred number in order to complete transition of care assessment, no answer able to leave a HIPAA compliant message. Placed call to patient wife Tom Wilson, emergency contact, 2 HIPAA identifiers verified. Explained purpose of call and completed transition of care assessment.  Wife reports that patient is doing much better. His oxygen saturation are in 96%, still has some fatigue but improved, tolerating daily walks. His appetite has improved, continuing steroid taper as prescribed.  Spouse/daughter are available to assist with recovery as needed. Patient has attended post discharge PCP visit, tested negative for covid.19. and able to return to work.  Patient uses a [facility] outpatient [facility] at [facility]     Objective:  [facility]26[facility]86for Pneumonia due to Covid 19 virus.History  includes: Attention deficit disorder  He was discharged to home on 11/01/19 , he was discharged home with Remote health services .    Assessment:  Patient voices good understanding of all discharge instructions.  See transition of care flowsheet for assessment details.   Plan:  Reviewed hospital discharge diagnosis of  Pneumonia due to Covid 18 virus   and discharge treatment plan using hospital discharge instructions, assessing medication adherence, reviewing problems requiring provider notification, and discussing the importance of follow up with  primary care provider  as directed.   No ongoing care management needs identified so will close case to [facility] Management services. Patient has already received [facility] care management outreach letter  per wife.   Joylene Draft, RN, BSN  [place] Management Coordinator  [phone]- Mobile [phone]- Toll Free Main Office

## 2019-11-24 MED FILL — MYDAYIS ER 50 MG CAPSULE: 50 | 30 days supply | Qty: 30 | Fill #0

## 2019-11-28 DIAGNOSIS — Z79899 Other long term (current) drug therapy: Secondary | ICD-10-CM | POA: Diagnosis not present

## 2019-11-28 DIAGNOSIS — F419 Anxiety disorder, unspecified: Secondary | ICD-10-CM | POA: Diagnosis not present

## 2019-11-28 DIAGNOSIS — F902 Attention-deficit hyperactivity disorder, combined type: Secondary | ICD-10-CM | POA: Diagnosis not present

## 2019-12-22 MED FILL — MYDAYIS ER 50 MG CAPSULE: 50 | 30 days supply | Qty: 30 | Fill #0

## 2020-01-11 DIAGNOSIS — M66872 Spontaneous rupture of other tendons, left ankle and foot: Secondary | ICD-10-CM | POA: Diagnosis not present

## 2020-01-11 DIAGNOSIS — J029 Acute pharyngitis, unspecified: Secondary | ICD-10-CM | POA: Diagnosis not present

## 2020-01-11 DIAGNOSIS — H9203 Otalgia, bilateral: Secondary | ICD-10-CM | POA: Diagnosis not present

## 2020-01-11 DIAGNOSIS — D509 Iron deficiency anemia, unspecified: Secondary | ICD-10-CM | POA: Diagnosis not present

## 2020-01-11 DIAGNOSIS — Z6832 Body mass index (BMI) 32.0-32.9, adult: Secondary | ICD-10-CM | POA: Diagnosis not present

## 2020-01-11 DIAGNOSIS — F909 Attention-deficit hyperactivity disorder, unspecified type: Secondary | ICD-10-CM | POA: Diagnosis not present

## 2020-01-11 DIAGNOSIS — Z8616 Personal history of COVID-19: Secondary | ICD-10-CM | POA: Diagnosis not present

## 2020-01-11 DIAGNOSIS — R7401 Elevation of levels of liver transaminase levels: Secondary | ICD-10-CM | POA: Diagnosis not present

## 2020-01-11 DIAGNOSIS — J1282 Pneumonia due to coronavirus disease 2019: Secondary | ICD-10-CM | POA: Diagnosis not present

## 2020-01-17 DIAGNOSIS — Z8616 Personal history of COVID-19: Secondary | ICD-10-CM | POA: Diagnosis not present

## 2020-01-17 DIAGNOSIS — Z6833 Body mass index (BMI) 33.0-33.9, adult: Secondary | ICD-10-CM | POA: Diagnosis not present

## 2020-01-17 DIAGNOSIS — F909 Attention-deficit hyperactivity disorder, unspecified type: Secondary | ICD-10-CM | POA: Diagnosis not present

## 2020-01-17 DIAGNOSIS — L989 Disorder of the skin and subcutaneous tissue, unspecified: Secondary | ICD-10-CM | POA: Diagnosis not present

## 2020-01-17 DIAGNOSIS — L309 Dermatitis, unspecified: Secondary | ICD-10-CM | POA: Diagnosis not present

## 2020-01-17 DIAGNOSIS — Z0001 Encounter for general adult medical examination with abnormal findings: Secondary | ICD-10-CM | POA: Diagnosis not present

## 2020-01-31 MED FILL — MYDAYIS ER 50 MG CAPSULE: 50 | 30 days supply | Qty: 30 | Fill #0

## 2020-02-28 DIAGNOSIS — Z79899 Other long term (current) drug therapy: Secondary | ICD-10-CM | POA: Diagnosis not present

## 2020-02-28 DIAGNOSIS — F902 Attention-deficit hyperactivity disorder, combined type: Secondary | ICD-10-CM | POA: Diagnosis not present

## 2020-02-28 MED FILL — MYDAYIS ER 50 MG CAPSULE: 50 | 30 days supply | Qty: 30 | Fill #0

## 2020-03-07 DIAGNOSIS — Z1211 Encounter for screening for malignant neoplasm of colon: Secondary | ICD-10-CM | POA: Diagnosis not present

## 2020-03-07 MED FILL — CLENPIQ 10-3.5-12 MG-GM -GM: 10-3.5-12 M | 2 days supply | Qty: 320 | Fill #0

## 2020-04-03 DIAGNOSIS — D125 Benign neoplasm of sigmoid colon: Secondary | ICD-10-CM | POA: Diagnosis not present

## 2020-04-03 DIAGNOSIS — K635 Polyp of colon: Secondary | ICD-10-CM | POA: Diagnosis not present

## 2020-04-03 DIAGNOSIS — D123 Benign neoplasm of transverse colon: Secondary | ICD-10-CM | POA: Diagnosis not present

## 2020-04-03 DIAGNOSIS — Z1211 Encounter for screening for malignant neoplasm of colon: Secondary | ICD-10-CM | POA: Diagnosis not present

## 2020-04-06 MED FILL — MYDAYIS ER 50 MG CAPSULE: 50 | 30 days supply | Qty: 30 | Fill #0

## 2020-05-07 MED FILL — MYDAYIS ER 50 MG CAPSULE: 50 | 30 days supply | Qty: 30 | Fill #0

## 2020-05-29 ENCOUNTER — Other Ambulatory Visit (HOSPITAL_COMMUNITY): Payer: Self-pay | Admitting: Internal Medicine

## 2020-05-29 DIAGNOSIS — Z79899 Other long term (current) drug therapy: Secondary | ICD-10-CM | POA: Diagnosis not present

## 2020-05-29 DIAGNOSIS — F902 Attention-deficit hyperactivity disorder, combined type: Secondary | ICD-10-CM | POA: Diagnosis not present

## 2020-05-29 DIAGNOSIS — F419 Anxiety disorder, unspecified: Secondary | ICD-10-CM | POA: Diagnosis not present

## 2020-06-04 MED FILL — MYDAYIS ER 50 MG CAPSULE: 50 | 30 days supply | Qty: 30 | Fill #0

## 2020-07-09 MED FILL — MYDAYIS ER 50 MG CAPSULE: 50 | 30 days supply | Qty: 30 | Fill #0

## 2020-08-08 MED FILL — MYDAYIS ER 50 MG CAPSULE: 50 | 30 days supply | Qty: 30 | Fill #0

## 2020-08-30 ENCOUNTER — Other Ambulatory Visit (HOSPITAL_COMMUNITY): Payer: Self-pay | Admitting: Internal Medicine

## 2020-08-30 DIAGNOSIS — F419 Anxiety disorder, unspecified: Secondary | ICD-10-CM | POA: Diagnosis not present

## 2020-08-30 DIAGNOSIS — Z79899 Other long term (current) drug therapy: Secondary | ICD-10-CM | POA: Diagnosis not present

## 2020-08-30 DIAGNOSIS — F902 Attention-deficit hyperactivity disorder, combined type: Secondary | ICD-10-CM | POA: Diagnosis not present

## 2020-09-06 MED FILL — MYDAYIS ER 50 MG CAPSULE: 50 | 30 days supply | Qty: 30 | Fill #0

## 2020-10-10 ENCOUNTER — Other Ambulatory Visit (HOSPITAL_COMMUNITY): Payer: Self-pay

## 2020-10-10 MED FILL — Amphetamine-Dextroamphetamine 3-Bead Cap ER 24HR 50 MG: ORAL | 21 days supply | Qty: 21 | Fill #0 | Status: CN

## 2020-10-11 ENCOUNTER — Other Ambulatory Visit (HOSPITAL_COMMUNITY): Payer: Self-pay

## 2020-10-12 ENCOUNTER — Other Ambulatory Visit (HOSPITAL_COMMUNITY): Payer: Self-pay

## 2020-10-12 MED FILL — Amphetamine-Dextroamphetamine 3-Bead Cap ER 24HR 50 MG: ORAL | 21 days supply | Qty: 21 | Fill #0 | Status: AC

## 2020-11-03 ENCOUNTER — Other Ambulatory Visit (HOSPITAL_COMMUNITY): Payer: Self-pay

## 2020-11-05 ENCOUNTER — Other Ambulatory Visit (HOSPITAL_COMMUNITY): Payer: Self-pay

## 2020-11-05 MED FILL — Amphetamine-Dextroamphetamine 3-Bead Cap ER 24HR 50 MG: ORAL | 30 days supply | Qty: 30 | Fill #0 | Status: AC

## 2020-12-06 ENCOUNTER — Other Ambulatory Visit (HOSPITAL_COMMUNITY): Payer: Self-pay

## 2020-12-08 ENCOUNTER — Other Ambulatory Visit (HOSPITAL_COMMUNITY): Payer: Self-pay

## 2020-12-10 ENCOUNTER — Other Ambulatory Visit (HOSPITAL_COMMUNITY): Payer: Self-pay

## 2020-12-10 MED ORDER — MYDAYIS 50 MG PO CP24
50.0000 mg | ORAL_CAPSULE | Freq: Every day | ORAL | 0 refills | Status: DC
  Filled 2020-12-10: qty 30, 30d supply, fill #0

## 2020-12-17 ENCOUNTER — Other Ambulatory Visit (HOSPITAL_COMMUNITY): Payer: Self-pay

## 2020-12-17 DIAGNOSIS — F902 Attention-deficit hyperactivity disorder, combined type: Secondary | ICD-10-CM | POA: Diagnosis not present

## 2020-12-17 DIAGNOSIS — Z79899 Other long term (current) drug therapy: Secondary | ICD-10-CM | POA: Diagnosis not present

## 2020-12-17 MED ORDER — MYDAYIS 50 MG PO CP24
50.0000 mg | ORAL_CAPSULE | Freq: Every day | ORAL | 0 refills | Status: DC
  Filled 2021-03-12: qty 30, 30d supply, fill #0

## 2020-12-17 MED ORDER — MYDAYIS 50 MG PO CP24
ORAL_CAPSULE | ORAL | 0 refills | Status: DC
  Filled 2021-01-08: qty 30, 30d supply, fill #0

## 2020-12-17 MED ORDER — MYDAYIS 50 MG PO CP24
ORAL_CAPSULE | ORAL | 0 refills | Status: DC
  Filled 2021-02-08: qty 30, 30d supply, fill #0

## 2021-01-08 ENCOUNTER — Other Ambulatory Visit (HOSPITAL_COMMUNITY): Payer: Self-pay

## 2021-02-05 IMAGING — DX DG CHEST 1V PORT
1 series · 1 of 1 positions shown · non-contrast
Comparison: None.

CLINICAL DATA: Shortness of breath. COVID positive.

EXAM:
PORTABLE CHEST 1 VIEW

[chest ap]
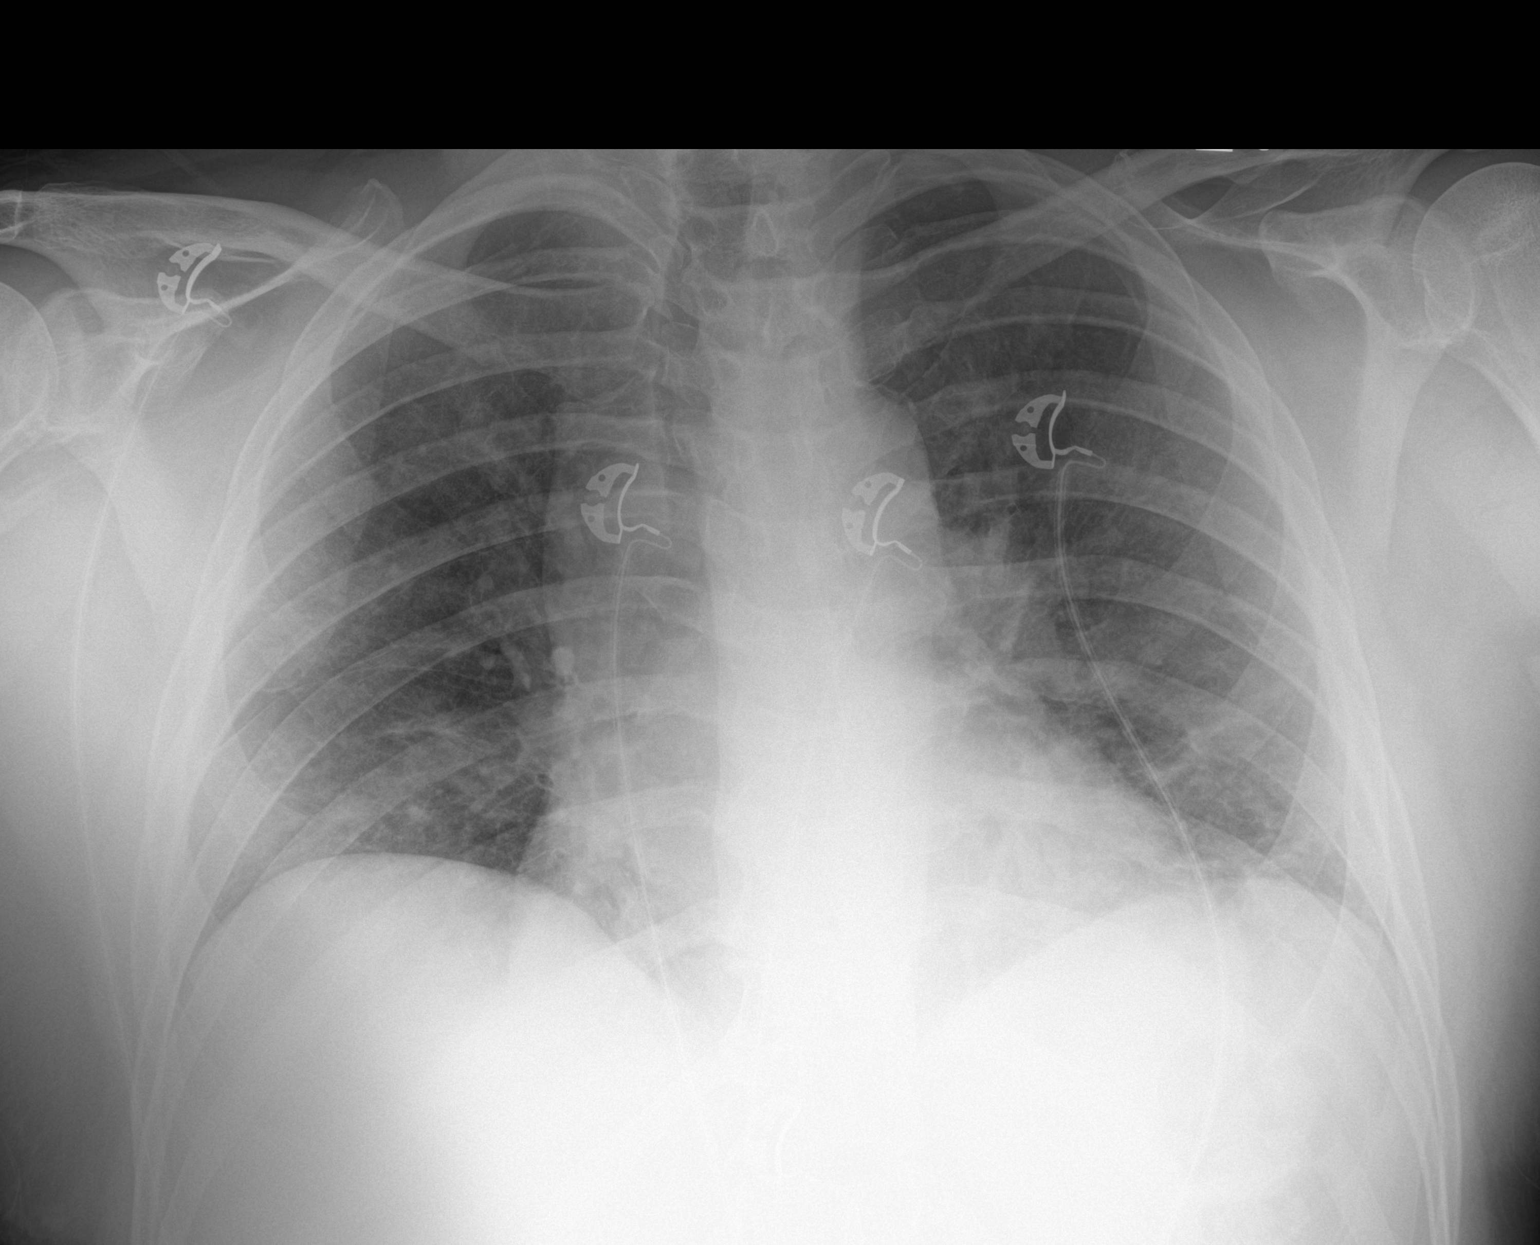

[1 of 1 positions shown; findings below may reference images not displayed]

FINDINGS: Heart size is exaggerated by low lung volumes. Patchy peripheral
airspace opacities are present bilaterally, left greater than right.
Mild pulmonary vascular congestion is present. The visualized soft
tissues and bony thorax are unremarkable.
IMPRESSION: 1. Patchy peripheral airspace disease bilaterally, left greater than
right. The findings are consistent with viral pneumonia.
2. Mild pulmonary vascular congestion.
3. Low lung volumes.

## 2021-02-08 ENCOUNTER — Other Ambulatory Visit (HOSPITAL_COMMUNITY): Payer: Self-pay

## 2021-03-12 ENCOUNTER — Other Ambulatory Visit (HOSPITAL_COMMUNITY): Payer: Self-pay

## 2021-04-08 ENCOUNTER — Other Ambulatory Visit (HOSPITAL_COMMUNITY): Payer: Self-pay

## 2021-04-08 MED ORDER — MYDAYIS 50 MG PO CP24
50.0000 mg | ORAL_CAPSULE | Freq: Every day | ORAL | 0 refills | Status: DC
  Filled 2021-04-08 – 2021-04-09 (×2): qty 30, 30d supply, fill #0

## 2021-04-09 ENCOUNTER — Other Ambulatory Visit (HOSPITAL_COMMUNITY): Payer: Self-pay

## 2021-04-23 ENCOUNTER — Other Ambulatory Visit (HOSPITAL_COMMUNITY): Payer: Self-pay

## 2021-04-23 DIAGNOSIS — F902 Attention-deficit hyperactivity disorder, combined type: Secondary | ICD-10-CM | POA: Diagnosis not present

## 2021-04-23 DIAGNOSIS — Z79899 Other long term (current) drug therapy: Secondary | ICD-10-CM | POA: Diagnosis not present

## 2021-04-23 MED ORDER — MYDAYIS 50 MG PO CP24
ORAL_CAPSULE | ORAL | 0 refills | Status: DC
  Filled 2021-06-12: qty 30, 30d supply, fill #0

## 2021-04-23 MED ORDER — MYDAYIS 50 MG PO CP24
ORAL_CAPSULE | ORAL | 0 refills | Status: DC
  Filled 2021-07-11: qty 30, 30d supply, fill #0

## 2021-04-23 MED ORDER — MYDAYIS 50 MG PO CP24
ORAL_CAPSULE | ORAL | 0 refills | Status: DC
  Filled 2021-05-13: qty 30, 30d supply, fill #0

## 2021-04-26 ENCOUNTER — Other Ambulatory Visit (HOSPITAL_COMMUNITY): Payer: Self-pay

## 2021-05-13 ENCOUNTER — Other Ambulatory Visit (HOSPITAL_COMMUNITY): Payer: Self-pay

## 2021-06-12 ENCOUNTER — Other Ambulatory Visit (HOSPITAL_COMMUNITY): Payer: Self-pay

## 2021-07-10 ENCOUNTER — Other Ambulatory Visit (HOSPITAL_COMMUNITY): Payer: Self-pay

## 2021-07-11 ENCOUNTER — Other Ambulatory Visit (HOSPITAL_COMMUNITY): Payer: Self-pay

## 2021-08-07 ENCOUNTER — Other Ambulatory Visit (HOSPITAL_BASED_OUTPATIENT_CLINIC_OR_DEPARTMENT_OTHER): Payer: Self-pay

## 2021-08-13 ENCOUNTER — Other Ambulatory Visit (HOSPITAL_BASED_OUTPATIENT_CLINIC_OR_DEPARTMENT_OTHER): Payer: Self-pay

## 2021-08-13 MED ORDER — MYDAYIS 50 MG PO CP24
ORAL_CAPSULE | ORAL | 0 refills | Status: DC
  Filled 2021-10-14: qty 30, 30d supply, fill #0

## 2021-08-13 MED ORDER — MYDAYIS 50 MG PO CP24
ORAL_CAPSULE | ORAL | 0 refills | Status: DC
  Filled 2021-09-11: qty 30, 30d supply, fill #0

## 2021-08-13 MED ORDER — MYDAYIS 50 MG PO CP24
ORAL_CAPSULE | ORAL | 0 refills | Status: DC
  Filled 2021-08-13: qty 30, 30d supply, fill #0

## 2021-08-14 ENCOUNTER — Other Ambulatory Visit (HOSPITAL_BASED_OUTPATIENT_CLINIC_OR_DEPARTMENT_OTHER): Payer: Self-pay

## 2021-09-11 ENCOUNTER — Other Ambulatory Visit (HOSPITAL_BASED_OUTPATIENT_CLINIC_OR_DEPARTMENT_OTHER): Payer: Self-pay

## 2021-09-12 ENCOUNTER — Other Ambulatory Visit (HOSPITAL_BASED_OUTPATIENT_CLINIC_OR_DEPARTMENT_OTHER): Payer: Self-pay

## 2021-09-12 DIAGNOSIS — I10 Essential (primary) hypertension: Secondary | ICD-10-CM | POA: Diagnosis not present

## 2021-09-12 DIAGNOSIS — R6882 Decreased libido: Secondary | ICD-10-CM | POA: Diagnosis not present

## 2021-09-12 MED ORDER — OLMESARTAN MEDOXOMIL-HCTZ 20-12.5 MG PO TABS
ORAL_TABLET | ORAL | 1 refills | Status: DC
  Filled 2021-09-12: qty 30, 30d supply, fill #0
  Filled 2021-10-15 – 2021-12-11 (×2): qty 30, 30d supply, fill #1

## 2021-10-10 ENCOUNTER — Other Ambulatory Visit (HOSPITAL_BASED_OUTPATIENT_CLINIC_OR_DEPARTMENT_OTHER): Payer: Self-pay

## 2021-10-10 DIAGNOSIS — I10 Essential (primary) hypertension: Secondary | ICD-10-CM | POA: Diagnosis not present

## 2021-10-10 MED ORDER — OLMESARTAN MEDOXOMIL-HCTZ 20-12.5 MG PO TABS
ORAL_TABLET | ORAL | 1 refills | Status: DC
  Filled 2021-10-10: qty 30, 30d supply, fill #0
  Filled 2021-11-11 – 2022-05-22 (×2): qty 30, 30d supply, fill #1

## 2021-10-14 ENCOUNTER — Other Ambulatory Visit (HOSPITAL_BASED_OUTPATIENT_CLINIC_OR_DEPARTMENT_OTHER): Payer: Self-pay

## 2021-10-15 ENCOUNTER — Other Ambulatory Visit (HOSPITAL_BASED_OUTPATIENT_CLINIC_OR_DEPARTMENT_OTHER): Payer: Self-pay

## 2021-10-22 ENCOUNTER — Other Ambulatory Visit (HOSPITAL_BASED_OUTPATIENT_CLINIC_OR_DEPARTMENT_OTHER): Payer: Self-pay

## 2021-10-22 DIAGNOSIS — Z79899 Other long term (current) drug therapy: Secondary | ICD-10-CM | POA: Diagnosis not present

## 2021-10-22 DIAGNOSIS — F902 Attention-deficit hyperactivity disorder, combined type: Secondary | ICD-10-CM | POA: Diagnosis not present

## 2021-10-22 MED ORDER — MYDAYIS 50 MG PO CP24
ORAL_CAPSULE | ORAL | 0 refills | Status: DC
  Filled 2022-01-13: qty 30, 30d supply, fill #0

## 2021-10-22 MED ORDER — MYDAYIS 50 MG PO CP24
ORAL_CAPSULE | ORAL | 0 refills | Status: DC
  Filled 2021-11-11: qty 30, 30d supply, fill #0

## 2021-10-22 MED ORDER — MYDAYIS 50 MG PO CP24
ORAL_CAPSULE | ORAL | 0 refills | Status: DC
  Filled 2021-12-11: qty 30, 30d supply, fill #0

## 2021-10-24 DIAGNOSIS — Z79899 Other long term (current) drug therapy: Secondary | ICD-10-CM | POA: Diagnosis not present

## 2021-11-08 ENCOUNTER — Encounter (HOSPITAL_BASED_OUTPATIENT_CLINIC_OR_DEPARTMENT_OTHER): Payer: Self-pay | Admitting: Pharmacist

## 2021-11-08 ENCOUNTER — Other Ambulatory Visit (HOSPITAL_BASED_OUTPATIENT_CLINIC_OR_DEPARTMENT_OTHER): Payer: Self-pay

## 2021-11-08 DIAGNOSIS — I10 Essential (primary) hypertension: Secondary | ICD-10-CM | POA: Diagnosis not present

## 2021-11-08 DIAGNOSIS — R891 Abnormal level of hormones in specimens from other organs, systems and tissues: Secondary | ICD-10-CM | POA: Diagnosis not present

## 2021-11-08 MED ORDER — OLMESARTAN MEDOXOMIL-HCTZ 20-12.5 MG PO TABS
ORAL_TABLET | ORAL | 1 refills | Status: DC
  Filled 2021-11-08: qty 30, 30d supply, fill #0
  Filled 2022-03-17: qty 30, 30d supply, fill #1

## 2021-11-08 MED ORDER — TESTOSTERONE CYPIONATE 200 MG/ML IM SOLN
INTRAMUSCULAR | 1 refills | Status: DC
  Filled 2021-11-08: qty 1, 28d supply, fill #0

## 2021-11-11 ENCOUNTER — Other Ambulatory Visit (HOSPITAL_BASED_OUTPATIENT_CLINIC_OR_DEPARTMENT_OTHER): Payer: Self-pay

## 2021-11-15 ENCOUNTER — Other Ambulatory Visit (HOSPITAL_BASED_OUTPATIENT_CLINIC_OR_DEPARTMENT_OTHER): Payer: Self-pay

## 2021-12-11 ENCOUNTER — Other Ambulatory Visit (HOSPITAL_BASED_OUTPATIENT_CLINIC_OR_DEPARTMENT_OTHER): Payer: Self-pay

## 2021-12-17 ENCOUNTER — Emergency Department
Admission: EM | Admit: 2021-12-17 | Discharge: 2021-12-17 | Disposition: A | Discharge status: Home / Self Care | Payer: 59 | Source: Home / Self Care | Attending: Family Medicine | Admitting: Family Medicine

## 2021-12-17 ENCOUNTER — Other Ambulatory Visit (HOSPITAL_BASED_OUTPATIENT_CLINIC_OR_DEPARTMENT_OTHER): Payer: Self-pay

## 2021-12-17 DIAGNOSIS — J069 Acute upper respiratory infection, unspecified: Secondary | ICD-10-CM

## 2021-12-17 LAB — POC SARS CORONAVIRUS 2 AG -  ED: SARS Coronavirus 2 Ag: NEGATIVE

## 2021-12-17 MED ORDER — PREDNISONE 20 MG PO TABS
ORAL_TABLET | ORAL | 0 refills | Status: DC
  Filled 2021-12-17: qty 11, 7d supply, fill #0

## 2021-12-17 MED ORDER — AZITHROMYCIN 250 MG PO TABS
ORAL_TABLET | ORAL | 0 refills | Status: DC
  Filled 2021-12-17: qty 6, 5d supply, fill #0

## 2021-12-17 NOTE — ED Triage Notes (Signed)
Pt states that he has a cough, chest congestion, chills, scratchy throat and some bilateral ear pain. X2 days   Pt states that he isn't vaccinated for covid. Pt states that he hasn't had flu vaccine.

## 2021-12-17 NOTE — Discharge Instructions (Addendum)
Take plain guaifenesin ('1200mg'$  extended release tabs such as Mucinex) twice daily, with plenty of water, for cough and congestion. Get adequate rest.   May use Afrin nasal spray (or generic oxymetazoline) each morning for about 5 days and then discontinue.  Also recommend using saline nasal spray several times daily and saline nasal irrigation (AYR is a common brand).  Use Flonase nasal spray each morning after using Afrin nasal spray and saline nasal irrigation. Try warm salt water gargles for sore throat.  May take Delsym Cough Suppressant ("12 Hour Cough Relief") at bedtime for nighttime cough.  Stop all antihistamines for now, and other non-prescription cough/cold preparations. May take Tylenol as needed (avoid ibuprofen while taking prednisone).

## 2021-12-17 NOTE — ED Provider Notes (Signed)
Tom Wilson CARE    CSN: [id number] Arrival date & time: 12/17/21  1236      History   Chief Complaint Chief Complaint  Patient presents with   Cough    Cough, chest congestion, chills, scratchy throat and bilateral ear pain. X2 days    HPI Tom Wilson is a 51 y.o. male.   Patient complains of two day history of non-productive cough, scratchy throat, mild sinus congestion, myalgias, fatigue and chills.  His cough is worse at night.  He denies pleuritic pain or shortness of breath but has developed mild wheezing although he does not have a history of asthma. He has a past history of pneumonia during a COVID infection 4/21  The history is provided by the patient.    Past Medical History:  Diagnosis Date   ADD (attention deficit disorder)     Patient Active Problem List   Diagnosis Date Noted   Pneumonia due to COVID-19 virus 10/27/2019   Hypoxia 10/27/2019   Lipoma of back - 5 cm 03/11/2013    Past Surgical History:  Procedure Laterality Date   ACHILLES TENDON SURGERY Left 11/03/2013   Procedure: LEFT ACHILLES TENDON REPAIR;  Surgeon: Wylene Simmer, MD;  Location: [facility];  Service: Orthopedics;  Laterality: Left;   COLONOSCOPY     LIPOMA EXCISION     WISDOM TOOTH EXTRACTION         Home Medications    Prior to Admission medications   Medication Sig Start Date End Date Taking? Authorizing Provider  Amphet-Dextroamphet 3-Bead ER (MYDAYIS) 50 MG CP24 Take 1 capsule by mouth daily 12/17/20  Yes   aspirin 81 MG chewable tablet Chew 81 mg by mouth daily.   Yes [provider]  azithromycin (ZITHROMAX Z-PAK) 250 MG tablet Take 2 tabs today; then begin one tab once daily for 4 more days. 12/17/21  Yes Kandra Nicolas, MD  MYDAYIS 50 MG CP24 Take 50 mg by mouth every morning.  10/24/19  Yes [provider]  olmesartan-hydrochlorothiazide (BENICAR HCT) 20-12.5 MG tablet Take 1 tablet every day by oral route. 09/12/21  Yes    predniSONE (DELTASONE) 20 MG tablet Take one tab by mouth twice daily for 4 days, then one daily for 3 days. Take with food. 12/17/21  Yes Kandra Nicolas, MD  testosterone cypionate (DEPOTESTOSTERONE CYPIONATE) 200 MG/ML injection Inject 0.5 mL every 4 weeks by intramuscular route. 11/08/21  Yes   acetaminophen (TYLENOL) 500 MG tablet Take 1,000 mg by mouth every 6 (six) hours as needed for mild pain or headache.    [provider]  albuterol (VENTOLIN HFA) 108 (90 Base) MCG/ACT inhaler Inhale 1 puff into the lungs every 4 (four) hours as needed for wheezing or shortness of breath. 10/31/19   Swayze, Ava, DO  Amphet-Dextroamphet 3-Bead ER (MYDAYIS) 50 MG CP24 Take 1 capsule (50 mg) by mouth daily. 12/10/20     Amphet-Dextroamphet 3-Bead ER (MYDAYIS) 50 MG CP24 Take 1 capsule by mouth daily (May be filled 30 days after date prescribed) 12/17/20     Amphet-Dextroamphet 3-Bead ER (MYDAYIS) 50 MG CP24 Take 1 capsule (50 mg) by mouth daily. 04/08/21     Amphet-Dextroamphet 3-Bead ER (MYDAYIS) 50 MG CP24 Take 1 capsule by mouth daily; May be filled 30 days after date prescribed 05/24/21     Amphet-Dextroamphet 3-Bead ER (MYDAYIS) 50 MG CP24 Take 1 capsule by mouth once a day 04/23/21     Amphet-Dextroamphet 3-Bead ER (MYDAYIS) 50  MG CP24 1 capsule by mouth daily; May be filled 30 days after date prescribed 08/13/21     Amphet-Dextroamphet 3-Bead ER (MYDAYIS) 50 MG CP24 1 capsule by mouth daily; patient has coupon 08/13/21     Amphet-Dextroamphet 3-Bead ER (MYDAYIS) 50 MG CP24 1 capsule by mouth daily; May be filled 60 days after date prescribed 10/22/21     Amphet-Dextroamphet 3-Bead ER (MYDAYIS) 50 MG CP24 1 capsule by mouth daily; May be filled 30 days after date prescribed 10/22/21     Amphet-Dextroamphet 3-Bead ER (MYDAYIS) 50 MG CP24 1 capsule by mouth daily; patient has coupon 10/22/21     Amphet-Dextroamphet 3-Bead ER 50 MG CP24 TAKE 1 CAPSULE BY MOUTH ONCE A DAY DO NOT FILL UNTIL 11/05/20 08/30/20  02/26/21  Rachel Moulds, DO  Amphet-Dextroamphet 3-Bead ER 50 MG CP24 TAKE 1 CAPSULE BY MOUTH DAILY 08/30/20 02/26/21  Rachel Moulds, DO  Amphet-Dextroamphet 3-Bead ER 50 MG CP24 TAKE 1 CAPSULE BY MOUTH DAILY 08/30/20 02/26/21  Rachel Moulds, DO  Amphet-Dextroamphet 3-Bead ER 50 MG CP24 TAKE 1 CAPSULE BY MOUTH DAILY; MAY BE FILLED 60 DAYS AFTER DATE PRESCRIBED 07/27/20 05/29/20 11/25/20  Rachel Moulds, DO  Amphet-Dextroamphet 3-Bead ER 50 MG CP24 TAKE 1 CAPSULE BY MOUTH DAILY 05/29/20 11/25/20  Rachel Moulds, DO  Amphet-Dextroamphet 3-Bead ER 50 MG CP24 TAKE 1 CAPSULE BY MOUTH DAILY 05/29/20 11/25/20  Johnnye Sima, Amy, DO  ascorbic acid (VITAMIN C) 500 MG tablet Take 1 tablet (500 mg total) by mouth daily. 11/01/19   Swayze, Ava, DO  chlorpheniramine-HYDROcodone (TUSSIONEX) 10-8 MG/5ML SUER Take 5 mLs by mouth every 12 (twelve) hours as needed for cough. 10/31/19   Swayze, Ava, DO  Cholecalciferol (HM VITAMIN D3) 100 MCG (4000 UT) CAPS Take 4,000 Units by mouth daily.    [provider]  feeding supplement, ENSURE ENLIVE, (ENSURE ENLIVE) LIQD Take 237 mLs by mouth 3 (three) times daily between meals. 10/31/19   Swayze, Ava, DO  olmesartan-hydrochlorothiazide (BENICAR HCT) 20-12.5 MG tablet Take 1 tablet every day by oral route. 10/10/21     olmesartan-hydrochlorothiazide (BENICAR HCT) 20-12.5 MG tablet Take 1 tablet every day by oral route. 11/08/21     phenol (CHLORASEPTIC) 1.4 % LIQD Use as directed 1 spray in the mouth or throat as needed for throat irritation / pain. 10/31/19   Swayze, Ava, DO  tamsulosin (FLOMAX) 0.4 MG CAPS capsule Take 1 capsule (0.4 mg total) by mouth daily. Patient not taking: Reported on 10/27/2019 06/13/19   Robyn Haber, MD  VITAMIN E PO Take 1 tablet by mouth daily.    [provider]  zinc sulfate 220 (50 Zn) MG capsule Take 1 capsule (220 mg total) by mouth daily. 11/01/19   Swayze, Ava, DO    Family History Family History  Problem Relation Age of Onset    Multiple sclerosis Mother    Cancer Father        lung cancer   Heart failure Father     Social History Social History   Tobacco Use   Smoking status: Never   Smokeless tobacco: Never  Substance Use Topics   Alcohol use: No   Drug use: No     Allergies   Patient has no known allergies.   Review of Systems Review of Systems + sore throat + cough No pleuritic pain + wheezing + nasal congestion + post-nasal drainage No sinus pain/pressure No itchy/red eyes ? earache No hemoptysis No SOB No fever, + chills No nausea No vomiting No  abdominal pain No diarrhea No urinary symptoms No skin rash + fatigue + myalgias No headache Used OTC meds (Advil) without relief   Physical Exam Triage Vital Signs ED Triage Vitals  Enc Vitals Group     BP 12/17/21 1313 115/76     Pulse Rate 12/17/21 1313 87     Resp 12/17/21 1313 18     Temp 12/17/21 1313 98.9 F (37.2 C)     Temp Source 12/17/21 1313 Oral     SpO2 12/17/21 1313 96 %     Weight 12/17/21 1309 230 lb (104.3 kg)     Height 12/17/21 1309 '5\' 11"'$  (1.803 m)     Head Circumference --      Peak Flow --      Pain Score 12/17/21 1309 0     Pain Loc --      Pain Edu? --      Excl. in [place]? --    No data found.  Updated Vital Signs BP 115/76 (BP Location: Right Arm)   Pulse 87   Temp 98.9 F (37.2 C) (Oral)   Resp 18   Ht '5\' 11"'$  (1.803 m)   Wt 104.3 kg   SpO2 96%   BMI 32.08 kg/m   Visual Acuity Right Eye Distance:   Left Eye Distance:   Bilateral Distance:    Right Eye Near:   Left Eye Near:    Bilateral Near:     Physical Exam Nursing notes and Vital Signs reviewed. Appearance:  Patient appears stated age, and in no acute distress Eyes:  Pupils are equal, round, and reactive to light and accomodation.  Extraocular movement is intact.  Conjunctivae are not inflamed  Ears:  Canals normal.  Tympanic membranes normal.  Nose:  Mildly congested turbinates.  No sinus tenderness.   Pharynx:   Normal Neck:  Supple.  Mildly enlarged lateral nodes are present, tender to palpation on the right.  Lungs:   Few scattered rhonchi.  Breath sounds are equal.  Moving air well. Heart:  Regular rate and rhythm without murmurs, rubs, or gallops.  Abdomen:  Nontender without masses or hepatosplenomegaly.  Bowel sounds are present.  No CVA or flank tenderness.  Extremities:  No edema.  Skin:  No rash present.   UC Treatments / Results  Labs (all labs ordered are listed, but only abnormal results are displayed) Labs Reviewed  POC SARS CORONAVIRUS 2 AG -  ED negative    EKG   Radiology No results found.  Procedures Procedures (including critical care time)  Medications Ordered in UC Medications - No data to display  Initial Impression / Assessment and Plan / UC Course  I have reviewed the triage vital signs and the nursing notes.  Pertinent labs & imaging results that were available during my care of the patient were reviewed by me and considered in my medical decision making (see chart for details).    Because of patient's history of wheezing with his current illness, will begin empiric Z-pack, and prednisone burst/taper. Followup with Family Doctor if not improved in about 7 to 10 days.  Final Clinical Impressions(s) / UC Diagnoses   Final diagnoses:  Viral URI with cough     Discharge Instructions      Take plain guaifenesin ('1200mg'$  extended release tabs such as Mucinex) twice daily, with plenty of water, for cough and congestion. Get adequate rest.   May use Afrin nasal spray (or generic oxymetazoline) each morning for about 5 days and  then discontinue.  Also recommend using saline nasal spray several times daily and saline nasal irrigation (AYR is a common brand).  Use Flonase nasal spray each morning after using Afrin nasal spray and saline nasal irrigation. Try warm salt water gargles for sore throat.  May take Delsym Cough Suppressant ("12 Hour Cough Relief") at  bedtime for nighttime cough.  Stop all antihistamines for now, and other non-prescription cough/cold preparations. May take Tylenol as needed (avoid ibuprofen while taking prednisone).      ED Prescriptions     Medication Sig Dispense Auth. Provider   predniSONE (DELTASONE) 20 MG tablet Take one tab by mouth twice daily for 4 days, then one daily for 3 days. Take with food. 11 tablet Kandra Nicolas, MD   azithromycin (ZITHROMAX Z-PAK) 250 MG tablet Take 2 tabs today; then begin one tab once daily for 4 more days. 6 tablet Kandra Nicolas, MD         Kandra Nicolas, MD 12/18/21 1134

## 2022-01-13 ENCOUNTER — Other Ambulatory Visit (HOSPITAL_BASED_OUTPATIENT_CLINIC_OR_DEPARTMENT_OTHER): Payer: Self-pay

## 2022-01-30 ENCOUNTER — Other Ambulatory Visit (HOSPITAL_BASED_OUTPATIENT_CLINIC_OR_DEPARTMENT_OTHER): Payer: Self-pay

## 2022-01-31 ENCOUNTER — Other Ambulatory Visit (HOSPITAL_BASED_OUTPATIENT_CLINIC_OR_DEPARTMENT_OTHER): Payer: Self-pay

## 2022-02-03 ENCOUNTER — Other Ambulatory Visit (HOSPITAL_BASED_OUTPATIENT_CLINIC_OR_DEPARTMENT_OTHER): Payer: Self-pay

## 2022-02-12 ENCOUNTER — Other Ambulatory Visit (HOSPITAL_BASED_OUTPATIENT_CLINIC_OR_DEPARTMENT_OTHER): Payer: Self-pay

## 2022-02-13 ENCOUNTER — Other Ambulatory Visit (HOSPITAL_BASED_OUTPATIENT_CLINIC_OR_DEPARTMENT_OTHER): Payer: Self-pay

## 2022-02-14 ENCOUNTER — Other Ambulatory Visit (HOSPITAL_BASED_OUTPATIENT_CLINIC_OR_DEPARTMENT_OTHER): Payer: Self-pay

## 2022-02-17 ENCOUNTER — Other Ambulatory Visit (HOSPITAL_BASED_OUTPATIENT_CLINIC_OR_DEPARTMENT_OTHER): Payer: Self-pay

## 2022-02-17 MED ORDER — MYDAYIS 50 MG PO CP24
ORAL_CAPSULE | ORAL | 0 refills | Status: DC
  Filled 2022-03-17: qty 30, 30d supply, fill #0

## 2022-02-17 MED ORDER — MYDAYIS 50 MG PO CP24
ORAL_CAPSULE | ORAL | 0 refills | Status: DC
  Filled 2022-02-17: qty 30, 30d supply, fill #0

## 2022-02-17 MED ORDER — MYDAYIS 50 MG PO CP24
50.0000 mg | ORAL_CAPSULE | Freq: Every day | ORAL | 0 refills | Status: DC
  Filled 2022-04-15: qty 30, 30d supply, fill #0

## 2022-02-21 ENCOUNTER — Other Ambulatory Visit (HOSPITAL_BASED_OUTPATIENT_CLINIC_OR_DEPARTMENT_OTHER): Payer: Self-pay

## 2022-02-24 ENCOUNTER — Other Ambulatory Visit (HOSPITAL_BASED_OUTPATIENT_CLINIC_OR_DEPARTMENT_OTHER): Payer: Self-pay

## 2022-03-07 ENCOUNTER — Other Ambulatory Visit (HOSPITAL_BASED_OUTPATIENT_CLINIC_OR_DEPARTMENT_OTHER): Payer: Self-pay

## 2022-03-17 ENCOUNTER — Other Ambulatory Visit (HOSPITAL_BASED_OUTPATIENT_CLINIC_OR_DEPARTMENT_OTHER): Payer: Self-pay

## 2022-03-19 ENCOUNTER — Other Ambulatory Visit (HOSPITAL_BASED_OUTPATIENT_CLINIC_OR_DEPARTMENT_OTHER): Payer: Self-pay

## 2022-03-19 DIAGNOSIS — E291 Testicular hypofunction: Secondary | ICD-10-CM | POA: Diagnosis not present

## 2022-03-19 DIAGNOSIS — I10 Essential (primary) hypertension: Secondary | ICD-10-CM | POA: Diagnosis not present

## 2022-03-19 MED ORDER — TESTOSTERONE CYPIONATE 200 MG/ML IM SOLN
INTRAMUSCULAR | 5 refills | Status: DC
  Filled 2022-03-19: qty 2, 14d supply, fill #0

## 2022-04-01 ENCOUNTER — Other Ambulatory Visit (HOSPITAL_BASED_OUTPATIENT_CLINIC_OR_DEPARTMENT_OTHER): Payer: Self-pay

## 2022-04-03 ENCOUNTER — Other Ambulatory Visit (HOSPITAL_BASED_OUTPATIENT_CLINIC_OR_DEPARTMENT_OTHER): Payer: Self-pay

## 2022-04-08 DIAGNOSIS — Z79899 Other long term (current) drug therapy: Secondary | ICD-10-CM | POA: Diagnosis not present

## 2022-04-08 DIAGNOSIS — F902 Attention-deficit hyperactivity disorder, combined type: Secondary | ICD-10-CM | POA: Diagnosis not present

## 2022-04-15 ENCOUNTER — Other Ambulatory Visit (HOSPITAL_BASED_OUTPATIENT_CLINIC_OR_DEPARTMENT_OTHER): Payer: Self-pay

## 2022-04-16 ENCOUNTER — Other Ambulatory Visit (HOSPITAL_BASED_OUTPATIENT_CLINIC_OR_DEPARTMENT_OTHER): Payer: Self-pay

## 2022-04-16 MED ORDER — TESTOSTERONE CYPIONATE 200 MG/ML IM SOLN
INTRAMUSCULAR | 5 refills | Status: DC
  Filled 2022-04-16: qty 2, 14d supply, fill #0

## 2022-04-30 ENCOUNTER — Other Ambulatory Visit (HOSPITAL_BASED_OUTPATIENT_CLINIC_OR_DEPARTMENT_OTHER): Payer: Self-pay

## 2022-05-04 ENCOUNTER — Other Ambulatory Visit (HOSPITAL_BASED_OUTPATIENT_CLINIC_OR_DEPARTMENT_OTHER): Payer: Self-pay

## 2022-05-06 ENCOUNTER — Other Ambulatory Visit (HOSPITAL_BASED_OUTPATIENT_CLINIC_OR_DEPARTMENT_OTHER): Payer: Self-pay

## 2022-05-19 ENCOUNTER — Other Ambulatory Visit (HOSPITAL_BASED_OUTPATIENT_CLINIC_OR_DEPARTMENT_OTHER): Payer: Self-pay

## 2022-05-21 ENCOUNTER — Other Ambulatory Visit (HOSPITAL_BASED_OUTPATIENT_CLINIC_OR_DEPARTMENT_OTHER): Payer: Self-pay

## 2022-05-22 ENCOUNTER — Other Ambulatory Visit (HOSPITAL_BASED_OUTPATIENT_CLINIC_OR_DEPARTMENT_OTHER): Payer: Self-pay

## 2022-05-22 MED ORDER — AMPHET-DEXTROAMPHET 3-BEAD ER 50 MG PO CP24
50.0000 mg | ORAL_CAPSULE | Freq: Every day | ORAL | 0 refills | Status: DC
  Filled 2022-07-22: qty 30, 30d supply, fill #0

## 2022-05-22 MED ORDER — AMPHET-DEXTROAMPHET 3-BEAD ER 50 MG PO CP24
50.0000 mg | ORAL_CAPSULE | Freq: Every day | ORAL | 0 refills | Status: DC
  Filled 2022-05-22: qty 30, 30d supply, fill #0

## 2022-05-22 MED ORDER — AMPHET-DEXTROAMPHET 3-BEAD ER 50 MG PO CP24
50.0000 mg | ORAL_CAPSULE | Freq: Every day | ORAL | 0 refills | Status: DC
  Filled 2022-06-23: qty 30, 30d supply, fill #0

## 2022-05-24 ENCOUNTER — Other Ambulatory Visit (HOSPITAL_BASED_OUTPATIENT_CLINIC_OR_DEPARTMENT_OTHER): Payer: Self-pay

## 2022-06-03 ENCOUNTER — Other Ambulatory Visit (HOSPITAL_BASED_OUTPATIENT_CLINIC_OR_DEPARTMENT_OTHER): Payer: Self-pay

## 2022-06-14 ENCOUNTER — Other Ambulatory Visit (HOSPITAL_BASED_OUTPATIENT_CLINIC_OR_DEPARTMENT_OTHER): Payer: Self-pay

## 2022-06-19 ENCOUNTER — Other Ambulatory Visit (HOSPITAL_BASED_OUTPATIENT_CLINIC_OR_DEPARTMENT_OTHER): Payer: Self-pay

## 2022-06-23 ENCOUNTER — Other Ambulatory Visit (HOSPITAL_BASED_OUTPATIENT_CLINIC_OR_DEPARTMENT_OTHER): Payer: Self-pay

## 2022-06-24 ENCOUNTER — Other Ambulatory Visit (HOSPITAL_BASED_OUTPATIENT_CLINIC_OR_DEPARTMENT_OTHER): Payer: Self-pay

## 2022-06-26 ENCOUNTER — Other Ambulatory Visit (HOSPITAL_BASED_OUTPATIENT_CLINIC_OR_DEPARTMENT_OTHER): Payer: Self-pay

## 2022-07-01 ENCOUNTER — Other Ambulatory Visit (HOSPITAL_BASED_OUTPATIENT_CLINIC_OR_DEPARTMENT_OTHER): Payer: Self-pay

## 2022-07-02 ENCOUNTER — Other Ambulatory Visit (HOSPITAL_BASED_OUTPATIENT_CLINIC_OR_DEPARTMENT_OTHER): Payer: Self-pay

## 2022-07-02 MED ORDER — OLMESARTAN MEDOXOMIL-HCTZ 20-12.5 MG PO TABS
1.0000 | ORAL_TABLET | Freq: Every day | ORAL | 0 refills | Status: DC
  Filled 2022-07-02: qty 30, 30d supply, fill #0

## 2022-07-22 ENCOUNTER — Other Ambulatory Visit (HOSPITAL_BASED_OUTPATIENT_CLINIC_OR_DEPARTMENT_OTHER): Payer: Self-pay

## 2022-08-14 ENCOUNTER — Other Ambulatory Visit (HOSPITAL_BASED_OUTPATIENT_CLINIC_OR_DEPARTMENT_OTHER): Payer: Self-pay

## 2022-08-16 ENCOUNTER — Other Ambulatory Visit (HOSPITAL_BASED_OUTPATIENT_CLINIC_OR_DEPARTMENT_OTHER): Payer: Self-pay

## 2022-08-18 ENCOUNTER — Other Ambulatory Visit (HOSPITAL_BASED_OUTPATIENT_CLINIC_OR_DEPARTMENT_OTHER): Payer: Self-pay

## 2022-08-19 ENCOUNTER — Other Ambulatory Visit (HOSPITAL_BASED_OUTPATIENT_CLINIC_OR_DEPARTMENT_OTHER): Payer: Self-pay

## 2022-08-21 ENCOUNTER — Other Ambulatory Visit (HOSPITAL_BASED_OUTPATIENT_CLINIC_OR_DEPARTMENT_OTHER): Payer: Self-pay

## 2022-08-21 ENCOUNTER — Other Ambulatory Visit: Payer: Self-pay

## 2022-08-22 ENCOUNTER — Other Ambulatory Visit (HOSPITAL_BASED_OUTPATIENT_CLINIC_OR_DEPARTMENT_OTHER): Payer: Self-pay

## 2022-08-22 MED ORDER — AMPHET-DEXTROAMPHET 3-BEAD ER 50 MG PO CP24
50.0000 mg | ORAL_CAPSULE | Freq: Every day | ORAL | 0 refills | Status: DC
  Filled 2022-08-22: qty 30, 30d supply, fill #0

## 2022-08-23 ENCOUNTER — Other Ambulatory Visit (HOSPITAL_BASED_OUTPATIENT_CLINIC_OR_DEPARTMENT_OTHER): Payer: Self-pay

## 2022-08-26 ENCOUNTER — Other Ambulatory Visit (HOSPITAL_BASED_OUTPATIENT_CLINIC_OR_DEPARTMENT_OTHER): Payer: Self-pay

## 2022-08-29 ENCOUNTER — Other Ambulatory Visit (HOSPITAL_BASED_OUTPATIENT_CLINIC_OR_DEPARTMENT_OTHER): Payer: Self-pay

## 2022-09-01 ENCOUNTER — Other Ambulatory Visit (HOSPITAL_BASED_OUTPATIENT_CLINIC_OR_DEPARTMENT_OTHER): Payer: Self-pay

## 2022-09-15 ENCOUNTER — Other Ambulatory Visit (HOSPITAL_BASED_OUTPATIENT_CLINIC_OR_DEPARTMENT_OTHER): Payer: Self-pay

## 2022-09-22 ENCOUNTER — Other Ambulatory Visit (HOSPITAL_BASED_OUTPATIENT_CLINIC_OR_DEPARTMENT_OTHER): Payer: Self-pay

## 2022-09-24 ENCOUNTER — Other Ambulatory Visit (HOSPITAL_BASED_OUTPATIENT_CLINIC_OR_DEPARTMENT_OTHER): Payer: Self-pay

## 2022-09-24 MED ORDER — AMPHET-DEXTROAMPHET 3-BEAD ER 50 MG PO CP24
50.0000 mg | ORAL_CAPSULE | Freq: Every day | ORAL | 0 refills | Status: DC
  Filled 2022-09-24: qty 30, 30d supply, fill #0

## 2022-09-24 MED ORDER — AMPHET-DEXTROAMPHET 3-BEAD ER 50 MG PO CP24
1.0000 | ORAL_CAPSULE | Freq: Every day | ORAL | 0 refills | Status: DC
  Filled 2022-09-24 – 2022-10-23 (×2): qty 30, 30d supply, fill #0

## 2022-09-26 ENCOUNTER — Other Ambulatory Visit (HOSPITAL_BASED_OUTPATIENT_CLINIC_OR_DEPARTMENT_OTHER): Payer: Self-pay

## 2022-09-29 ENCOUNTER — Other Ambulatory Visit (HOSPITAL_BASED_OUTPATIENT_CLINIC_OR_DEPARTMENT_OTHER): Payer: Self-pay

## 2022-10-03 ENCOUNTER — Other Ambulatory Visit (HOSPITAL_BASED_OUTPATIENT_CLINIC_OR_DEPARTMENT_OTHER): Payer: Self-pay

## 2022-10-08 DIAGNOSIS — F902 Attention-deficit hyperactivity disorder, combined type: Secondary | ICD-10-CM | POA: Diagnosis not present

## 2022-10-08 DIAGNOSIS — Z79899 Other long term (current) drug therapy: Secondary | ICD-10-CM | POA: Diagnosis not present

## 2022-10-09 DIAGNOSIS — Z79891 Long term (current) use of opiate analgesic: Secondary | ICD-10-CM | POA: Diagnosis not present

## 2022-10-09 DIAGNOSIS — Z79899 Other long term (current) drug therapy: Secondary | ICD-10-CM | POA: Diagnosis not present

## 2022-10-09 DIAGNOSIS — Z5181 Encounter for therapeutic drug level monitoring: Secondary | ICD-10-CM | POA: Diagnosis not present

## 2022-10-12 ENCOUNTER — Other Ambulatory Visit (HOSPITAL_BASED_OUTPATIENT_CLINIC_OR_DEPARTMENT_OTHER): Payer: Self-pay

## 2022-10-22 ENCOUNTER — Other Ambulatory Visit (HOSPITAL_BASED_OUTPATIENT_CLINIC_OR_DEPARTMENT_OTHER): Payer: Self-pay

## 2022-10-23 ENCOUNTER — Other Ambulatory Visit (HOSPITAL_BASED_OUTPATIENT_CLINIC_OR_DEPARTMENT_OTHER): Payer: Self-pay

## 2022-10-31 ENCOUNTER — Other Ambulatory Visit (HOSPITAL_BASED_OUTPATIENT_CLINIC_OR_DEPARTMENT_OTHER): Payer: Self-pay

## 2022-11-03 ENCOUNTER — Other Ambulatory Visit (HOSPITAL_BASED_OUTPATIENT_CLINIC_OR_DEPARTMENT_OTHER): Payer: Self-pay

## 2022-11-20 ENCOUNTER — Other Ambulatory Visit (HOSPITAL_BASED_OUTPATIENT_CLINIC_OR_DEPARTMENT_OTHER): Payer: Self-pay

## 2022-11-20 MED ORDER — AMPHET-DEXTROAMPHET 3-BEAD ER 50 MG PO CP24
50.0000 mg | ORAL_CAPSULE | Freq: Every morning | ORAL | 0 refills | Status: DC
  Filled 2022-11-20: qty 30, 30d supply, fill #0

## 2022-12-24 ENCOUNTER — Other Ambulatory Visit (HOSPITAL_BASED_OUTPATIENT_CLINIC_OR_DEPARTMENT_OTHER): Payer: Self-pay

## 2022-12-25 ENCOUNTER — Other Ambulatory Visit: Payer: Self-pay

## 2022-12-25 ENCOUNTER — Other Ambulatory Visit (HOSPITAL_BASED_OUTPATIENT_CLINIC_OR_DEPARTMENT_OTHER): Payer: Self-pay

## 2022-12-25 MED ORDER — AMPHET-DEXTROAMPHET 3-BEAD ER 50 MG PO CP24
50.0000 mg | ORAL_CAPSULE | Freq: Every morning | ORAL | 0 refills | Status: DC
  Filled 2022-12-25: qty 30, 30d supply, fill #0

## 2023-01-21 ENCOUNTER — Other Ambulatory Visit (HOSPITAL_BASED_OUTPATIENT_CLINIC_OR_DEPARTMENT_OTHER): Payer: Self-pay

## 2023-01-21 ENCOUNTER — Other Ambulatory Visit: Payer: Self-pay

## 2023-01-21 MED ORDER — AMPHET-DEXTROAMPHET 3-BEAD ER 50 MG PO CP24
1.0000 | ORAL_CAPSULE | Freq: Every day | ORAL | 0 refills | Status: DC
  Filled 2023-01-24: qty 30, 30d supply, fill #0

## 2023-01-24 ENCOUNTER — Other Ambulatory Visit (HOSPITAL_BASED_OUTPATIENT_CLINIC_OR_DEPARTMENT_OTHER): Payer: Self-pay

## 2023-02-23 ENCOUNTER — Other Ambulatory Visit (HOSPITAL_BASED_OUTPATIENT_CLINIC_OR_DEPARTMENT_OTHER): Payer: Self-pay

## 2023-02-23 MED ORDER — AMPHET-DEXTROAMPHET 3-BEAD ER 50 MG PO CP24
50.0000 mg | ORAL_CAPSULE | Freq: Every day | ORAL | 0 refills | Status: DC
  Filled 2023-02-23: qty 30, 30d supply, fill #0

## 2023-03-27 ENCOUNTER — Other Ambulatory Visit (HOSPITAL_BASED_OUTPATIENT_CLINIC_OR_DEPARTMENT_OTHER): Payer: Self-pay

## 2023-03-27 MED ORDER — AMPHET-DEXTROAMPHET 3-BEAD ER 50 MG PO CP24
50.0000 mg | ORAL_CAPSULE | Freq: Every day | ORAL | 0 refills | Status: DC
  Filled 2023-03-27: qty 30, 30d supply, fill #0

## 2023-04-09 DIAGNOSIS — F902 Attention-deficit hyperactivity disorder, combined type: Secondary | ICD-10-CM | POA: Diagnosis not present

## 2023-04-09 DIAGNOSIS — Z79899 Other long term (current) drug therapy: Secondary | ICD-10-CM | POA: Diagnosis not present

## 2023-04-20 DIAGNOSIS — Z1211 Encounter for screening for malignant neoplasm of colon: Secondary | ICD-10-CM | POA: Diagnosis not present

## 2023-04-24 ENCOUNTER — Other Ambulatory Visit (HOSPITAL_BASED_OUTPATIENT_CLINIC_OR_DEPARTMENT_OTHER): Payer: Self-pay

## 2023-04-24 MED ORDER — AMPHET-DEXTROAMPHET 3-BEAD ER 50 MG PO CP24
1.0000 | ORAL_CAPSULE | Freq: Every day | ORAL | 0 refills | Status: DC
  Filled 2023-04-24: qty 30, 30d supply, fill #0

## 2023-04-27 ENCOUNTER — Other Ambulatory Visit (HOSPITAL_BASED_OUTPATIENT_CLINIC_OR_DEPARTMENT_OTHER): Payer: Self-pay

## 2023-05-14 ENCOUNTER — Other Ambulatory Visit (HOSPITAL_BASED_OUTPATIENT_CLINIC_OR_DEPARTMENT_OTHER): Payer: Self-pay

## 2023-05-14 MED ORDER — CLENPIQ 10-3.5-12 MG-GM -GM/175ML PO SOLN
175.0000 mL | ORAL | 0 refills | Status: AC
  Filled 2023-05-14 – 2023-06-15 (×2): qty 350, 1d supply, fill #0

## 2023-05-26 ENCOUNTER — Other Ambulatory Visit (HOSPITAL_BASED_OUTPATIENT_CLINIC_OR_DEPARTMENT_OTHER): Payer: Self-pay

## 2023-05-27 ENCOUNTER — Other Ambulatory Visit (HOSPITAL_BASED_OUTPATIENT_CLINIC_OR_DEPARTMENT_OTHER): Payer: Self-pay

## 2023-05-27 ENCOUNTER — Encounter (HOSPITAL_BASED_OUTPATIENT_CLINIC_OR_DEPARTMENT_OTHER): Payer: Self-pay | Admitting: Family Medicine

## 2023-05-27 ENCOUNTER — Ambulatory Visit (HOSPITAL_BASED_OUTPATIENT_CLINIC_OR_DEPARTMENT_OTHER): Payer: Commercial Managed Care - PPO | Admitting: Family Medicine

## 2023-05-27 VITALS — BP 132/87 | HR 91 | Ht 70.0 in | Wt 226.6 lb

## 2023-05-27 DIAGNOSIS — N529 Male erectile dysfunction, unspecified: Secondary | ICD-10-CM | POA: Insufficient documentation

## 2023-05-27 DIAGNOSIS — I10 Essential (primary) hypertension: Secondary | ICD-10-CM | POA: Insufficient documentation

## 2023-05-27 MED ORDER — OLMESARTAN MEDOXOMIL-HCTZ 20-12.5 MG PO TABS
1.0000 | ORAL_TABLET | Freq: Every day | ORAL | 1 refills | Status: DC
  Filled 2023-05-27: qty 90, 90d supply, fill #0
  Filled 2023-09-16: qty 90, 90d supply, fill #1

## 2023-05-27 MED ORDER — AMPHET-DEXTROAMPHET 3-BEAD ER 50 MG PO CP24
1.0000 | ORAL_CAPSULE | Freq: Every day | ORAL | 0 refills | Status: DC
  Filled 2023-05-27: qty 30, 30d supply, fill #0

## 2023-05-27 NOTE — Assessment & Plan Note (Signed)
We will resume medication at this time, prescription for olmesartan-hydrochlorothiazide sent to pharmacy on file.  Recommend intermittent monitoring of blood pressure at home, DASH diet, handout provided Patient will return in about 2 weeks to check BMP with resumption of antihypertensive.  Will plan for follow-up in about 6 to 8 weeks to monitor response to resuming medication

## 2023-05-27 NOTE — Progress Notes (Signed)
New Patient Office Visit  Subjective    Patient ID: Tom Wilson, male    DOB: [birth year]  Age: 52 y.o. MRN: [id number]  CC:  Chief Complaint  Patient presents with   New Patient (Initial Visit)    New patient bp has been running high     HPI [name] presents to establish care Last PCP - Dr. Loreta Ave, Re[facility] May  HTN: was taking olmesartan-hydrochlorothiazide 20-12.5. reports was controlled with that medication; has been without medication for about 6 months. Denies any CP or headaches. Requesting refill today.  Testosterone: reports that he was having issues with ED, was being treated with testosterone injections, does not recall dose, has been without medication for about 6 months. Reports mixed benefits with testosterone. Does report that he was not having significant issues with ED until starting ADD medication.  ADD: follows with [facility] attention specialists. Currently utilizing Mydayis for treatment.  Patient is originally from [place]. Has lived here for about 23 years. Patient works as Comptroller. Outside of work, he enjoys yard work, Education officer, environmental working, Conservation officer, historic buildings.  Outpatient Encounter Medications as of 05/27/2023  Medication Sig   acetaminophen (TYLENOL) 500 MG tablet Take 1,000 mg by mouth every 6 (six) hours as needed for mild pain or headache.   Amphet-Dextroamphet 3-Bead ER 50 MG CP24 Take 1 capsule (50 mg total) by mouth daily.   ascorbic acid (VITAMIN C) 500 MG tablet Take 1 tablet (500 mg total) by mouth daily.   aspirin 81 MG chewable tablet Chew 81 mg by mouth daily.   Cholecalciferol (HM VITAMIN D3) 100 MCG (4000 UT) CAPS Take 4,000 Units by mouth daily.   MYDAYIS 50 MG CP24 Take 50 mg by mouth every morning.    VITAMIN E PO Take 1 tablet by mouth daily.   zinc sulfate 220 (50 Zn) MG capsule Take 1 capsule (220 mg total) by mouth daily.   olmesartan-hydrochlorothiazide (BENICAR HCT) 20-12.5 MG tablet Take 1 tablet by mouth daily.   Sod  Picosulfate-Mag Ox-Cit Acd (CLENPIQ) 10-3.5-12 MG-GM -GM/175ML SOLN Take 175 mLs by mouth twice  as directed. (Patient not taking: Reported on 05/27/2023)   testosterone cypionate (DEPOTESTOSTERONE CYPIONATE) 200 MG/ML injection Inject 0.5 mL (100 mg) into the muscle once weekly. Discard vial after each dose. (Patient not taking: Reported on 05/27/2023)   [DISCONTINUED] albuterol (VENTOLIN HFA) 108 (90 Base) MCG/ACT inhaler Inhale 1 puff into the lungs every 4 (four) hours as needed for wheezing or shortness of breath. (Patient not taking: Reported on 05/27/2023)   [DISCONTINUED] Amphet-Dextroamphet 3-Bead ER (MYDAYIS) 50 MG CP24 Take 1 capsule (50 mg) by mouth daily. (Patient not taking: Reported on 05/27/2023)   [DISCONTINUED] Amphet-Dextroamphet 3-Bead ER (MYDAYIS) 50 MG CP24 Take 1 capsule by mouth daily (May be filled 30 days after date prescribed)   [DISCONTINUED] Amphet-Dextroamphet 3-Bead ER (MYDAYIS) 50 MG CP24 Take 1 capsule by mouth daily   [DISCONTINUED] Amphet-Dextroamphet 3-Bead ER (MYDAYIS) 50 MG CP24 Take 1 capsule (50 mg) by mouth daily.   [DISCONTINUED] Amphet-Dextroamphet 3-Bead ER (MYDAYIS) 50 MG CP24 Take 1 capsule by mouth daily; May be filled 30 days after date prescribed   [DISCONTINUED] Amphet-Dextroamphet 3-Bead ER (MYDAYIS) 50 MG CP24 Take 1 capsule by mouth once a day   [DISCONTINUED] Amphet-Dextroamphet 3-Bead ER (MYDAYIS) 50 MG CP24 1 capsule by mouth daily; May be filled 30 days after date prescribed   [DISCONTINUED] Amphet-Dextroamphet 3-Bead ER (MYDAYIS) 50 MG CP24 1 capsule by mouth daily; patient has coupon   [  DISCONTINUED] Amphet-Dextroamphet 3-Bead ER (MYDAYIS) 50 MG CP24 1 capsule by mouth daily; May be filled 30 days after date prescribed   [DISCONTINUED] Amphet-Dextroamphet 3-Bead ER (MYDAYIS) 50 MG CP24 1 capsule by mouth daily; patient has coupon   [DISCONTINUED] Amphet-Dextroamphet 3-Bead ER (MYDAYIS) 50 MG CP24 1 capsule by mouth daily; May be filled 30 days  after date prescribed   [DISCONTINUED] Amphet-Dextroamphet 3-Bead ER (MYDAYIS) 50 MG CP24 1 capsule by mouth daily; patient has coupon   [DISCONTINUED] Amphet-Dextroamphet 3-Bead ER (MYDAYIS) 50 MG CP24 Take 1 capsule (50 mg) by mouth daily.   [DISCONTINUED] Amphet-Dextroamphet 3-Bead ER (MYDAYIS) 50 MG CP24 Take 1capsule  (50 mg) by mouth daily.   [DISCONTINUED] Amphet-Dextroamphet 3-Bead ER 50 MG CP24 TAKE 1 CAPSULE BY MOUTH ONCE A DAY DO NOT FILL UNTIL 11/05/20   [DISCONTINUED] Amphet-Dextroamphet 3-Bead ER 50 MG CP24 TAKE 1 CAPSULE BY MOUTH DAILY   [DISCONTINUED] Amphet-Dextroamphet 3-Bead ER 50 MG CP24 TAKE 1 CAPSULE BY MOUTH DAILY   [DISCONTINUED] Amphet-Dextroamphet 3-Bead ER 50 MG CP24 TAKE 1 CAPSULE BY MOUTH DAILY; MAY BE FILLED 60 DAYS AFTER DATE PRESCRIBED 07/27/20   [DISCONTINUED] Amphet-Dextroamphet 3-Bead ER 50 MG CP24 TAKE 1 CAPSULE BY MOUTH DAILY   [DISCONTINUED] Amphet-Dextroamphet 3-Bead ER 50 MG CP24 TAKE 1 CAPSULE BY MOUTH DAILY   [DISCONTINUED] Amphet-Dextroamphet 3-Bead ER 50 MG CP24 Take 1 capsule (50 mg total) by mouth in the morning.   [DISCONTINUED] azithromycin (ZITHROMAX Z-PAK) 250 MG tablet Take 2 tabs today; then begin one tab once daily for 4 more days. (Patient not taking: Reported on 05/27/2023)   [DISCONTINUED] chlorpheniramine-HYDROcodone (TUSSIONEX) 10-8 MG/5ML SUER Take 5 mLs by mouth every 12 (twelve) hours as needed for cough. (Patient not taking: Reported on 05/27/2023)   [DISCONTINUED] feeding supplement, ENSURE ENLIVE, (ENSURE ENLIVE) LIQD Take 237 mLs by mouth 3 (three) times daily between meals. (Patient not taking: Reported on 05/27/2023)   [DISCONTINUED] olmesartan-hydrochlorothiazide (BENICAR HCT) 20-12.5 MG tablet Take 1 tablet every day by oral route. (Patient not taking: Reported on 05/27/2023)   [DISCONTINUED] olmesartan-hydrochlorothiazide (BENICAR HCT) 20-12.5 MG tablet Take 1 tablet every day by oral route. (Patient not taking: Reported on  05/27/2023)   [DISCONTINUED] olmesartan-hydrochlorothiazide (BENICAR HCT) 20-12.5 MG tablet Take 1 tablet by mouth daily. (Patient not taking: Reported on 05/27/2023)   [DISCONTINUED] phenol (CHLORASEPTIC) 1.4 % LIQD Use as directed 1 spray in the mouth or throat as needed for throat irritation / pain. (Patient not taking: Reported on 05/27/2023)   [DISCONTINUED] predniSONE (DELTASONE) 20 MG tablet Take one tab by mouth twice daily for 4 days, then one daily for 3 days. Take with food. (Patient not taking: Reported on 05/27/2023)   [DISCONTINUED] tamsulosin (FLOMAX) 0.4 MG CAPS capsule Take 1 capsule (0.4 mg total) by mouth daily. (Patient not taking: Reported on 10/27/2019)   [DISCONTINUED] testosterone cypionate (DEPOTESTOSTERONE CYPIONATE) 200 MG/ML injection Inject 0.5 mL every 4 weeks by intramuscular route. (Patient not taking: Reported on 05/27/2023)   [DISCONTINUED] testosterone cypionate (DEPOTESTOSTERONE CYPIONATE) 200 MG/ML injection Inject 0.5 mL (100 mg) into the muscle every week. Discard vial after use. (Patient not taking: Reported on 05/27/2023)   No facility-administered encounter medications on file as of 05/27/2023.    Past Medical History:  Diagnosis Date   ADD (attention deficit disorder)    Hypertension     Past Surgical History:  Procedure Laterality Date   ACHILLES TENDON SURGERY Left 11/03/2013   Procedure: LEFT ACHILLES TENDON REPAIR;  Surgeon: Toni Arthurs, MD;  Location: [facility];  Service: Orthopedics;  Laterality: Left;   COLONOSCOPY     LIPOMA EXCISION     WISDOM TOOTH EXTRACTION      Family History  Problem Relation Age of Onset   Multiple sclerosis Mother    Cancer Father        lung cancer   Heart failure Father     Social History   Socioeconomic History   Marital status: Married    Spouse name: Not on file   Number of children: Not on file   Years of education: Not on file   Highest education level: Not on file  Occupational  History   Not on file  Tobacco Use   Smoking status: Never    Passive exposure: Never   Smokeless tobacco: Never  Vaping Use   Vaping status: Never Used  Substance and Sexual Activity   Alcohol use: No   Drug use: No   Sexual activity: Not on file  Other Topics Concern   Not on file  Social History Narrative   Not on file   Social Determinants of Health   Financial Resource Strain: Not on file  Food Insecurity: Not on file  Transportation Needs: Not on file  Physical Activity: Not on file  Stress: Not on file  Social Connections: Not on file  Intimate Partner Violence: Not on file    Objective    BP (!) 159/98 (BP Location: Right Arm, Patient Position: Sitting, Cuff Size: Normal)   Pulse 91   Ht 5\' 10"  (1.778 m)   Wt 226 lb 9.6 oz (102.8 kg)   SpO2 100%   BMI 32.51 kg/m   Physical Exam  52 year old male in no acute distress Cardiovascular exam with regular rate and rhythm Lungs clear to auscultation bilaterally  Assessment & Plan:   Primary hypertension Assessment & Plan: We will resume medication at this time, prescription for olmesartan-hydrochlorothiazide sent to pharmacy on file.  Recommend intermittent monitoring of blood pressure at home, DASH diet, handout provided Patient will return in about 2 weeks to check BMP with resumption of antihypertensive.  Will plan for follow-up in about 6 to 8 weeks to monitor response to resuming medication  Orders: -     Basic metabolic panel; Future  Erectile dysfunction, unspecified erectile dysfunction type Assessment & Plan: Mixed results with testosterone therapy in the past.  We did discuss considerations today and patient does have some interest in possibly resuming testosterone replacement therapy.  Given this, we will plan to recheck testosterone levels given that patient has been without medication for about 6 months.  He will complete this with labs for hypertension in about 2 weeks If testosterone is found to  be low and patient is wanting to proceed with testosterone replacement therapy, which he indicates today, we will proceed with referral to urology for management  Orders: -     Testosterone; Future  Other orders -     Olmesartan Medoxomil-HCTZ; Take 1 tablet by mouth daily.  Dispense: 90 tablet; Refill: 1  Return in about 6 weeks (around 07/08/2023) for hypertension, med check.    ___________________________________________ Azahel Belcastro de [place], MD, ABFM, CAQSM Primary Care and Sports Medicine [facility]

## 2023-05-27 NOTE — Assessment & Plan Note (Signed)
Mixed results with testosterone therapy in the past.  We did discuss considerations today and patient does have some interest in possibly resuming testosterone replacement therapy.  Given this, we will plan to recheck testosterone levels given that patient has been without medication for about 6 months.  He will complete this with labs for hypertension in about 2 weeks If testosterone is found to be low and patient is wanting to proceed with testosterone replacement therapy, which he indicates today, we will proceed with referral to urology for management

## 2023-06-11 ENCOUNTER — Other Ambulatory Visit (HOSPITAL_BASED_OUTPATIENT_CLINIC_OR_DEPARTMENT_OTHER): Payer: Commercial Managed Care - PPO

## 2023-06-15 ENCOUNTER — Other Ambulatory Visit (HOSPITAL_BASED_OUTPATIENT_CLINIC_OR_DEPARTMENT_OTHER): Payer: Self-pay | Admitting: Family Medicine

## 2023-06-15 ENCOUNTER — Other Ambulatory Visit (HOSPITAL_BASED_OUTPATIENT_CLINIC_OR_DEPARTMENT_OTHER): Payer: Self-pay

## 2023-06-15 DIAGNOSIS — N529 Male erectile dysfunction, unspecified: Secondary | ICD-10-CM | POA: Diagnosis not present

## 2023-06-15 DIAGNOSIS — I10 Essential (primary) hypertension: Secondary | ICD-10-CM | POA: Diagnosis not present

## 2023-06-16 LAB — BASIC METABOLIC PANEL
BUN/Creatinine Ratio: 17 (ref 9–20)
BUN: 19 mg/dL (ref 6–24)
CO2: 23 mmol/L (ref 20–29)
Calcium: 9.5 mg/dL (ref 8.7–10.2)
Chloride: 104 mmol/L (ref 96–106)
Creatinine, Ser: 1.1 mg/dL (ref 0.76–1.27)
Glucose: 110 mg/dL — ABNORMAL HIGH (ref 70–99)
Potassium: 4.1 mmol/L (ref 3.5–5.2)
Sodium: 142 mmol/L (ref 134–144)
eGFR: 80 mL/min/{1.73_m2} (ref >59)

## 2023-06-16 LAB — TESTOSTERONE: Testosterone: 354 ng/dL (ref 264–916)

## 2023-06-23 DIAGNOSIS — K635 Polyp of colon: Secondary | ICD-10-CM | POA: Diagnosis not present

## 2023-06-23 DIAGNOSIS — D122 Benign neoplasm of ascending colon: Secondary | ICD-10-CM | POA: Diagnosis not present

## 2023-06-23 DIAGNOSIS — Z860101 Personal history of adenomatous and serrated colon polyps: Secondary | ICD-10-CM | POA: Diagnosis not present

## 2023-06-23 DIAGNOSIS — Z1211 Encounter for screening for malignant neoplasm of colon: Secondary | ICD-10-CM | POA: Diagnosis not present

## 2023-06-23 LAB — HM COLONOSCOPY

## 2023-06-25 ENCOUNTER — Other Ambulatory Visit (HOSPITAL_BASED_OUTPATIENT_CLINIC_OR_DEPARTMENT_OTHER): Payer: Self-pay

## 2023-06-26 ENCOUNTER — Other Ambulatory Visit (HOSPITAL_BASED_OUTPATIENT_CLINIC_OR_DEPARTMENT_OTHER): Payer: Self-pay

## 2023-06-26 MED ORDER — AMPHET-DEXTROAMPHET 3-BEAD ER 50 MG PO CP24
1.0000 | ORAL_CAPSULE | Freq: Every day | ORAL | 0 refills | Status: DC
  Filled 2023-06-26: qty 30, 30d supply, fill #0

## 2023-07-16 ENCOUNTER — Ambulatory Visit (INDEPENDENT_AMBULATORY_CARE_PROVIDER_SITE_OTHER): Payer: Commercial Managed Care - PPO | Admitting: Family Medicine

## 2023-07-16 ENCOUNTER — Encounter (HOSPITAL_BASED_OUTPATIENT_CLINIC_OR_DEPARTMENT_OTHER): Payer: Self-pay | Admitting: *Deleted

## 2023-07-16 ENCOUNTER — Encounter (HOSPITAL_BASED_OUTPATIENT_CLINIC_OR_DEPARTMENT_OTHER): Payer: Self-pay | Admitting: Family Medicine

## 2023-07-16 VITALS — BP 133/86 | HR 78 | Ht 70.0 in | Wt 232.2 lb

## 2023-07-16 DIAGNOSIS — I10 Essential (primary) hypertension: Secondary | ICD-10-CM | POA: Diagnosis not present

## 2023-07-16 NOTE — Assessment & Plan Note (Signed)
 Tom Wilson has been doing well with resuming blood pressure medication.  He has not noticed any side effects with restarting medication.  He did return for labs and these were normal.  He has not been checking blood pressure at home.  Symptomatically, he does feel that he has been doing better with being back on blood pressure medication.  Denies any chest pain, headaches, lightheadedness or dizziness. Blood pressure improved in office today compared to last appointment.  Systolic is at goal, diastolic is borderline.  Slight improvement on recheck in office today Can continue with current medication regimen, no changes at this time.  Recommend intermittent monitoring of blood pressure at home, DASH diet Will plan for follow-up in about 4 to 5 months to follow-up on blood pressure or sooner as needed Plan to arrange for physical after next appointment, last physical September 2024

## 2023-07-16 NOTE — Progress Notes (Signed)
    Procedures performed today:    None.  Independent interpretation of notes and tests performed by another provider:   None.  Brief History, Exam, Impression, and Recommendations:    BP 122/87 (BP Location: Right Arm, Patient Position: Sitting, Cuff Size: Normal)   Pulse 78   Ht 5' 10 (1.778 m)   Wt 232 lb 3.2 oz (105.3 kg)   SpO2 98%   BMI 33.32 kg/m   Primary hypertension Assessment & Plan: Tom Wilson has been doing well with resuming blood pressure medication.  He has not noticed any side effects with restarting medication.  He did return for labs and these were normal.  He has not been checking blood pressure at home.  Symptomatically, he does feel that he has been doing better with being back on blood pressure medication.  Denies any chest pain, headaches, lightheadedness or dizziness. Blood pressure improved in office today compared to last appointment.  Systolic is at goal, diastolic is borderline.  Slight improvement on recheck in office today Can continue with current medication regimen, no changes at this time.  Recommend intermittent monitoring of blood pressure at home, DASH diet Will plan for follow-up in about 4 to 5 months to follow-up on blood pressure or sooner as needed Plan to arrange for physical after next appointment, last physical September 2024   Return in about 4 months (around 11/13/2023) for hypertension.   ___________________________________________ Paticia Moster de [place], MD, ABFM, CAQSM Primary Care and Sports Medicine [facility]

## 2023-07-16 NOTE — Patient Instructions (Signed)
  Medication Instructions:  Your physician recommends that you continue on your current medications as directed. Please refer to the Current Medication list given to you today. --If you need a refill on any your medications before your next appointment, please call your pharmacy first. If no refills are authorized on file call the office.--   Follow-Up: Your next appointment:   Your physician recommends that you schedule a follow-up appointment in: 4-5 month follow up  with Dr. de Cuba  You will receive a text message or e-mail with a link to a survey about your care and experience with us  today! We would greatly appreciate your feedback!   Thanks for letting us  be apart of your health journey!!  Primary Care and Sports Medicine   Dr. Quintin sheerer Cuba   We encourage you to activate your patient portal called MyChart.  Sign up information is provided on this After Visit Summary.  MyChart is used to connect with patients for Virtual Visits (Telemedicine).  Patients are able to view lab/test results, encounter notes, upcoming appointments, etc.  Non-urgent messages can be sent to your provider as well. To learn more about what you can do with MyChart, please visit --  [email or web address].

## 2023-07-21 ENCOUNTER — Encounter (HOSPITAL_BASED_OUTPATIENT_CLINIC_OR_DEPARTMENT_OTHER): Payer: Self-pay | Admitting: *Deleted

## 2023-07-27 ENCOUNTER — Other Ambulatory Visit (HOSPITAL_BASED_OUTPATIENT_CLINIC_OR_DEPARTMENT_OTHER): Payer: Self-pay

## 2023-07-27 MED ORDER — AMPHET-DEXTROAMPHET 3-BEAD ER 50 MG PO CP24
50.0000 mg | ORAL_CAPSULE | Freq: Every day | ORAL | 0 refills | Status: DC
  Filled 2023-07-27: qty 30, 30d supply, fill #0

## 2023-08-24 ENCOUNTER — Other Ambulatory Visit (HOSPITAL_BASED_OUTPATIENT_CLINIC_OR_DEPARTMENT_OTHER): Payer: Self-pay

## 2023-08-27 ENCOUNTER — Other Ambulatory Visit (HOSPITAL_BASED_OUTPATIENT_CLINIC_OR_DEPARTMENT_OTHER): Payer: Self-pay

## 2023-08-27 MED ORDER — AMPHET-DEXTROAMPHET 3-BEAD ER 50 MG PO CP24
1.0000 | ORAL_CAPSULE | Freq: Every day | ORAL | 0 refills | Status: DC
  Filled 2023-08-27: qty 10, 10d supply, fill #0
  Filled 2023-08-27: qty 20, 20d supply, fill #0
  Filled 2023-08-27: qty 10, 10d supply, fill #0
  Filled 2023-08-27: qty 20, 20d supply, fill #0

## 2023-09-16 ENCOUNTER — Other Ambulatory Visit (HOSPITAL_BASED_OUTPATIENT_CLINIC_OR_DEPARTMENT_OTHER): Payer: Self-pay

## 2023-09-28 ENCOUNTER — Other Ambulatory Visit (HOSPITAL_BASED_OUTPATIENT_CLINIC_OR_DEPARTMENT_OTHER): Payer: Self-pay

## 2023-09-28 MED ORDER — AMPHET-DEXTROAMPHET 3-BEAD ER 50 MG PO CP24
1.0000 | ORAL_CAPSULE | Freq: Every day | ORAL | 0 refills | Status: DC
  Filled 2023-09-28: qty 30, 30d supply, fill #0

## 2023-10-29 ENCOUNTER — Other Ambulatory Visit (HOSPITAL_COMMUNITY): Payer: Self-pay

## 2023-10-29 ENCOUNTER — Other Ambulatory Visit (HOSPITAL_BASED_OUTPATIENT_CLINIC_OR_DEPARTMENT_OTHER): Payer: Self-pay

## 2023-10-29 MED ORDER — AMPHET-DEXTROAMPHET 3-BEAD ER 50 MG PO CP24
1.0000 | ORAL_CAPSULE | Freq: Every day | ORAL | 0 refills | Status: DC
Start: 2023-10-29 — End: 2023-12-01
  Filled 2023-10-29 (×3): qty 30, 30d supply, fill #0

## 2023-10-30 ENCOUNTER — Other Ambulatory Visit (HOSPITAL_BASED_OUTPATIENT_CLINIC_OR_DEPARTMENT_OTHER): Payer: Self-pay

## 2023-11-17 ENCOUNTER — Encounter (HOSPITAL_BASED_OUTPATIENT_CLINIC_OR_DEPARTMENT_OTHER): Payer: Self-pay | Admitting: Family Medicine

## 2023-11-17 ENCOUNTER — Ambulatory Visit (INDEPENDENT_AMBULATORY_CARE_PROVIDER_SITE_OTHER): Payer: Commercial Managed Care - PPO | Admitting: Family Medicine

## 2023-11-17 VITALS — BP 132/88 | HR 76 | Ht 71.0 in | Wt 239.6 lb

## 2023-11-17 DIAGNOSIS — I10 Essential (primary) hypertension: Secondary | ICD-10-CM

## 2023-11-17 DIAGNOSIS — Z0000 Encounter for general adult medical examination without abnormal findings: Secondary | ICD-10-CM

## 2023-11-17 NOTE — Progress Notes (Signed)
    Procedures performed today:    None.  Independent interpretation of notes and tests performed by another provider:   None.  Brief History, Exam, Impression, and Recommendations:    BP 132/88   Pulse 76   Ht 5\' 11"  (1.803 m)   Wt 239 lb 9.6 oz (108.7 kg)   SpO2 98%   BMI 33.42 kg/m   Primary hypertension Assessment & Plan: Blood pressure initially elevated in office, did improve on recheck, still borderline on recheck.  He continues with medication as prescribed.  Has not had any issues with medication, takes regularly first thing in the morning, did take this morning's dose.  Denies any issues with chest pain, headaches.  No lightheadedness or dizziness. At this time, recommend continue with current medication regimen.  We did review lifestyle modifications to assist with further blood pressure control.  May need to consider further adjustment in pharmacotherapy based on progress.  Recommend home monitoring as well for additional information regarding blood pressure control.  He indicates that he will work towards doing so and maintain log   Wellness examination -     CBC with Differential/Platelet; Future -     Comprehensive metabolic panel with GFR; Future -     Hemoglobin A1c; Future -     Lipid panel; Future -     TSH Rfx on Abnormal to Free T4; Future  Return in about 5 months (around 04/18/2024) for CPE with fasting labs 1 week prior.   ___________________________________________ Delpha Perko de [place], MD, ABFM, CAQSM Primary Care and Sports Medicine [facility]

## 2023-11-17 NOTE — Patient Instructions (Signed)
  Medication Instructions:  Your physician recommends that you continue on your current medications as directed. Please refer to the Current Medication list given to you today. --If you need a refill on any your medications before your next appointment, please call your pharmacy first. If no refills are authorized on file call the office.-- Lab Work: Your physician has recommended that you have lab work today: 1 week before physical If you have labs (blood work) drawn today and your tests are completely normal, you will receive your results via MyChart message OR a phone call from our staff.  Please ensure you check your voicemail in the event that you authorized detailed messages to be left on a delegated number. If you have any lab test that is abnormal or we need to change your treatment, we will call you to review the results.  Referrals/Procedures/Imaging: no  Follow-Up: Your next appointment:   Your physician recommends that you schedule a follow-up appointment in: 4-5 months for physical with Dr. de Peru  You will receive a text message or e-mail with a link to a survey about your care and experience with us  today! We would greatly appreciate your feedback!   Thanks for letting us  be apart of your health journey!!  Primary Care and Sports Medicine   Dr. Court Distance Peru   We encourage you to activate your patient portal called "MyChart".  Sign up information is provided on this After Visit Summary.  MyChart is used to connect with patients for Virtual Visits (Telemedicine).  Patients are able to view lab/test results, encounter notes, upcoming appointments, etc.  Non-urgent messages can be sent to your provider as well. To learn more about what you can do with MyChart, please visit --  [email or web address].

## 2023-11-17 NOTE — Assessment & Plan Note (Signed)
 Blood pressure initially elevated in office, did improve on recheck, still borderline on recheck.  He continues with medication as prescribed.  Has not had any issues with medication, takes regularly first thing in the morning, did take this morning's dose.  Denies any issues with chest pain, headaches.  No lightheadedness or dizziness. At this time, recommend continue with current medication regimen.  We did review lifestyle modifications to assist with further blood pressure control.  May need to consider further adjustment in pharmacotherapy based on progress.  Recommend home monitoring as well for additional information regarding blood pressure control.  He indicates that he will work towards doing so and maintain log

## 2023-11-28 ENCOUNTER — Other Ambulatory Visit (HOSPITAL_COMMUNITY): Payer: Self-pay

## 2023-11-28 ENCOUNTER — Other Ambulatory Visit (HOSPITAL_BASED_OUTPATIENT_CLINIC_OR_DEPARTMENT_OTHER): Payer: Self-pay

## 2023-12-01 ENCOUNTER — Other Ambulatory Visit (HOSPITAL_COMMUNITY): Payer: Self-pay

## 2023-12-01 ENCOUNTER — Other Ambulatory Visit (HOSPITAL_BASED_OUTPATIENT_CLINIC_OR_DEPARTMENT_OTHER): Payer: Self-pay

## 2023-12-01 MED ORDER — AMPHET-DEXTROAMPHET 3-BEAD ER 50 MG PO CP24
1.0000 | ORAL_CAPSULE | Freq: Every day | ORAL | 0 refills | Status: DC
  Filled 2023-12-01 (×3): qty 30, 30d supply, fill #0

## 2023-12-02 ENCOUNTER — Other Ambulatory Visit (HOSPITAL_COMMUNITY): Payer: Self-pay

## 2023-12-21 ENCOUNTER — Other Ambulatory Visit (HOSPITAL_BASED_OUTPATIENT_CLINIC_OR_DEPARTMENT_OTHER): Payer: Self-pay | Admitting: Family Medicine

## 2023-12-21 ENCOUNTER — Other Ambulatory Visit (HOSPITAL_BASED_OUTPATIENT_CLINIC_OR_DEPARTMENT_OTHER): Payer: Self-pay

## 2023-12-21 ENCOUNTER — Other Ambulatory Visit (HOSPITAL_COMMUNITY): Payer: Self-pay

## 2023-12-21 MED ORDER — OLMESARTAN MEDOXOMIL-HCTZ 20-12.5 MG PO TABS
1.0000 | ORAL_TABLET | Freq: Every day | ORAL | 1 refills | Status: DC
  Filled 2023-12-21 (×2): qty 90, 90d supply, fill #0
  Filled 2024-03-30: qty 90, 90d supply, fill #1

## 2023-12-22 ENCOUNTER — Other Ambulatory Visit: Payer: Self-pay

## 2024-01-01 ENCOUNTER — Other Ambulatory Visit (HOSPITAL_COMMUNITY): Payer: Self-pay

## 2024-01-01 MED ORDER — AMPHET-DEXTROAMPHET 3-BEAD ER 50 MG PO CP24
1.0000 | ORAL_CAPSULE | Freq: Every day | ORAL | 0 refills | Status: DC
  Filled 2024-01-01: qty 30, 30d supply, fill #0

## 2024-01-28 ENCOUNTER — Other Ambulatory Visit: Payer: Self-pay

## 2024-01-28 ENCOUNTER — Other Ambulatory Visit (HOSPITAL_COMMUNITY): Payer: Self-pay

## 2024-01-28 MED ORDER — AMPHET-DEXTROAMPHET 3-BEAD ER 50 MG PO CP24
1.0000 | ORAL_CAPSULE | Freq: Every day | ORAL | 0 refills | Status: DC
  Filled 2024-01-29 (×2): qty 30, 30d supply, fill #0

## 2024-01-29 ENCOUNTER — Other Ambulatory Visit (HOSPITAL_COMMUNITY): Payer: Self-pay

## 2024-03-02 ENCOUNTER — Other Ambulatory Visit: Payer: Self-pay

## 2024-03-02 ENCOUNTER — Other Ambulatory Visit (HOSPITAL_COMMUNITY): Payer: Self-pay

## 2024-03-02 MED ORDER — AMPHET-DEXTROAMPHET 3-BEAD ER 50 MG PO CP24
1.0000 | ORAL_CAPSULE | Freq: Every day | ORAL | 0 refills | Status: DC
  Filled 2024-03-02: qty 30, 30d supply, fill #0

## 2024-03-03 ENCOUNTER — Other Ambulatory Visit (HOSPITAL_COMMUNITY): Payer: Self-pay

## 2024-03-17 ENCOUNTER — Other Ambulatory Visit (HOSPITAL_COMMUNITY): Payer: Self-pay

## 2024-03-30 ENCOUNTER — Other Ambulatory Visit (HOSPITAL_BASED_OUTPATIENT_CLINIC_OR_DEPARTMENT_OTHER): Payer: Self-pay | Admitting: Family Medicine

## 2024-03-30 ENCOUNTER — Other Ambulatory Visit (HOSPITAL_COMMUNITY): Payer: Self-pay

## 2024-03-30 ENCOUNTER — Telehealth (HOSPITAL_BASED_OUTPATIENT_CLINIC_OR_DEPARTMENT_OTHER): Payer: Self-pay | Admitting: *Deleted

## 2024-03-30 NOTE — Telephone Encounter (Signed)
 Copied from CRM #[id number]. Topic: General - Other >> Mar 30, 2024  9:29 AM Larissa RAMAN wrote: Reason for CRM: Patient returning missed call from [facility]. Patient states he is going to followup with the Greig Mages regarding refill request. He states he will callback after speaking with her.

## 2024-03-30 NOTE — Telephone Encounter (Signed)
FYI on medication

## 2024-03-31 ENCOUNTER — Other Ambulatory Visit (HOSPITAL_COMMUNITY): Payer: Self-pay

## 2024-04-01 ENCOUNTER — Other Ambulatory Visit: Payer: Self-pay

## 2024-04-01 ENCOUNTER — Other Ambulatory Visit (HOSPITAL_COMMUNITY): Payer: Self-pay

## 2024-04-01 MED ORDER — AMPHET-DEXTROAMPHET 3-BEAD ER 50 MG PO CP24
1.0000 | ORAL_CAPSULE | Freq: Every day | ORAL | 0 refills | Status: DC
  Filled 2024-04-01: qty 30, 30d supply, fill #0

## 2024-04-20 ENCOUNTER — Encounter (HOSPITAL_BASED_OUTPATIENT_CLINIC_OR_DEPARTMENT_OTHER): Payer: Self-pay | Admitting: Family Medicine

## 2024-04-20 ENCOUNTER — Ambulatory Visit (INDEPENDENT_AMBULATORY_CARE_PROVIDER_SITE_OTHER): Admitting: Family Medicine

## 2024-04-20 VITALS — BP 121/79 | HR 70 | Ht 70.0 in | Wt 232.5 lb

## 2024-04-20 DIAGNOSIS — Z0000 Encounter for general adult medical examination without abnormal findings: Secondary | ICD-10-CM | POA: Diagnosis not present

## 2024-04-20 NOTE — Progress Notes (Signed)
 Subjective:    CC: Annual Physical Exam  HPI:  Tom Wilson is a 53 y.o. presenting for annual physical  I reviewed the past medical history, family history, social history, surgical history, and allergies today and no changes were needed.  Please see the problem list section below in epic for further details.  Past Medical History: Past Medical History:  Diagnosis Date   ADD (attention deficit disorder)    Hypertension    Past Surgical History: Past Surgical History:  Procedure Laterality Date   ACHILLES TENDON SURGERY Left 11/03/2013   Procedure: LEFT ACHILLES TENDON REPAIR;  Surgeon: Norleen Armor, MD;  Location: [facility];  Service: Orthopedics;  Laterality: Left;   COLONOSCOPY     LIPOMA EXCISION     WISDOM TOOTH EXTRACTION     Social History: Social History   Socioeconomic History   Marital status: Married    Spouse name: Not on file   Number of children: Not on file   Years of education: Not on file   Highest education level: Not on file  Occupational History   Not on file  Tobacco Use   Smoking status: Never    Passive exposure: Never   Smokeless tobacco: Never  Vaping Use   Vaping status: Never Used  Substance and Sexual Activity   Alcohol use: No   Drug use: No   Sexual activity: Not on file  Other Topics Concern   Not on file  Social History Narrative   Not on file   Social Drivers of Health   Financial Resource Strain: Not on file  Food Insecurity: Not on file  Transportation Needs: Not on file  Physical Activity: Not on file  Stress: Not on file  Social Connections: Not on file   Family History: Family History  Problem Relation Age of Onset   Multiple sclerosis Mother    Cancer Father        lung cancer   Heart failure Father    Allergies: No Known Allergies Medications: See med rec.  Review of Systems: No headache, visual changes, nausea, vomiting, diarrhea, constipation, dizziness, abdominal pain, skin rash,  fevers, chills, night sweats, swollen lymph nodes, weight loss, chest pain, body aches, joint swelling, muscle aches, shortness of breath, mood changes, visual or auditory hallucinations.  Objective:    BP 121/79 (BP Location: Right Arm, Patient Position: Sitting, Cuff Size: Large)   Pulse 70   Ht 5' 10 (1.778 m)   Wt 232 lb 8 oz (105.5 kg)   SpO2 96%   BMI 33.36 kg/m   General: Well Developed, well nourished, and in no acute distress.  Neuro: Alert and oriented x3, extra-ocular muscles intact, sensation grossly intact. Cranial nerves II through XII are intact, motor, sensory, and coordinative functions are all intact. HEENT: Normocephalic, atraumatic, pupils equal round reactive to light, neck supple, no masses, no lymphadenopathy, thyroid nonpalpable. Oropharynx, nasopharynx, external ear canals are unremarkable. Skin: Warm and dry, no rashes noted.  Cardiac: Regular rate and rhythm, no murmurs rubs or gallops.  Respiratory: Clear to auscultation bilaterally. Not using accessory muscles, speaking in full sentences.  Abdominal: Soft, nontender, nondistended, positive bowel sounds, no masses, no organomegaly. Musculoskeletal: Shoulder, elbow, wrist, hip, knee, ankle stable, and with full range of motion.  Impression and Recommendations:    Wellness examination Assessment & Plan: Routine HCM labs ordered. HCM reviewed/discussed. Anticipatory guidance regarding healthy weight, lifestyle and choices given. Recommend healthy diet.  Recommend approximately 150 minutes/week of moderate intensity  exercise Recommend regular dental and vision exams Always use seatbelt/lap and shoulder restraints Recommend using smoke alarms and checking batteries at least twice a year Recommend using sunscreen when outside Discussed colon cancer screening recommendations, options.  Patient is UTD Discussed recommendations for shingles vaccine.  Patient will consider Discussed immunization  recommendations   Return in about 4 months (around 08/21/2024) for hypertension.   ___________________________________________ Laronn Devonshire de [place], MD, ABFM, CAQSM Primary Care and Sports Medicine [facility]

## 2024-04-20 NOTE — Assessment & Plan Note (Signed)
 Routine HCM labs ordered. HCM reviewed/discussed. Anticipatory guidance regarding healthy weight, lifestyle and choices given. Recommend healthy diet.  Recommend approximately 150 minutes/week of moderate intensity exercise Recommend regular dental and vision exams Always use seatbelt/lap and shoulder restraints Recommend using smoke alarms and checking batteries at least twice a year Recommend using sunscreen when outside Discussed colon cancer screening recommendations, options.  Patient is UTD Discussed recommendations for shingles vaccine.  Patient will consider Discussed immunization recommendations

## 2024-04-20 NOTE — Patient Instructions (Signed)
  Medication Instructions:  Your physician recommends that you continue on your current medications as directed. Please refer to the Current Medication list given to you today. --If you need a refill on any your medications before your next appointment, please call your pharmacy first. If no refills are authorized on file call the office.-- Lab Work: Your physician has recommended that you have lab work today: come back fasting If you have labs (blood work) drawn today and your tests are completely normal, you will receive your results via MyChart message OR a phone call from our staff.  Please ensure you check your voicemail in the event that you authorized detailed messages to be left on a delegated number. If you have any lab test that is abnormal or we need to change your treatment, we will call you to review the results.  Follow-Up: Your next appointment:   Your physician recommends that you schedule a follow-up appointment in: 4 month follow up with Dr. de [place]  You will receive a text message or e-mail with a link to a survey about your care and experience with us  today! We would greatly appreciate your feedback!   Thanks for letting us  be apart of your health journey!!  Primary Care and Sports Medicine   Dr. Quintin sheerer Peru   We encourage you to activate your patient portal called MyChart.  Sign up information is provided on this After Visit Summary.  MyChart is used to connect with patients for Virtual Visits (Telemedicine).  Patients are able to view lab/test results, encounter notes, upcoming appointments, etc.  Non-urgent messages can be sent to your provider as well. To learn more about what you can do with MyChart, please visit --  [email or web address].

## 2024-04-28 ENCOUNTER — Other Ambulatory Visit (HOSPITAL_COMMUNITY): Payer: Self-pay

## 2024-04-28 ENCOUNTER — Other Ambulatory Visit (HOSPITAL_BASED_OUTPATIENT_CLINIC_OR_DEPARTMENT_OTHER): Payer: Self-pay | Admitting: *Deleted

## 2024-04-28 DIAGNOSIS — Z0000 Encounter for general adult medical examination without abnormal findings: Secondary | ICD-10-CM

## 2024-04-28 DIAGNOSIS — N529 Male erectile dysfunction, unspecified: Secondary | ICD-10-CM

## 2024-04-28 DIAGNOSIS — I10 Essential (primary) hypertension: Secondary | ICD-10-CM

## 2024-04-28 MED ORDER — AMPHET-DEXTROAMPHET 3-BEAD ER 50 MG PO CP24
1.0000 | ORAL_CAPSULE | Freq: Every day | ORAL | 0 refills | Status: DC
  Filled 2024-04-29: qty 90, 90d supply, fill #0

## 2024-04-29 ENCOUNTER — Other Ambulatory Visit: Payer: Self-pay

## 2024-04-29 ENCOUNTER — Other Ambulatory Visit (HOSPITAL_COMMUNITY): Payer: Self-pay

## 2024-04-29 LAB — CBC WITH DIFFERENTIAL/PLATELET
Basophils Absolute: 0.1 x10E3/uL (ref 0.0–0.2)
Basos: 1 %
EOS (ABSOLUTE): 0.2 x10E3/uL (ref 0.0–0.4)
Eos: 3 %
Hematocrit: 47.5 % (ref 37.5–51.0)
Hemoglobin: 15.8 g/dL (ref 13.0–17.7)
Immature Grans (Abs): 0 x10E3/uL (ref 0.0–0.1)
Immature Granulocytes: 0 %
Lymphocytes Absolute: 2.8 x10E3/uL (ref 0.7–3.1)
Lymphs: 40 %
MCH: 28.4 pg (ref 26.6–33.0)
MCHC: 33.3 g/dL (ref 31.5–35.7)
MCV: 85 fL (ref 79–97)
Monocytes Absolute: 0.6 x10E3/uL (ref 0.1–0.9)
Monocytes: 9 %
Neutrophils Absolute: 3.3 x10E3/uL (ref 1.4–7.0)
Neutrophils: 47 %
Platelets: 256 x10E3/uL (ref 150–450)
RBC: 5.56 x10E6/uL (ref 4.14–5.80)
RDW: 13.8 % (ref 11.6–15.4)
WBC: 7.1 x10E3/uL (ref 3.4–10.8)

## 2024-04-29 LAB — COMPREHENSIVE METABOLIC PANEL WITH GFR
ALT: 15 IU/L (ref 0–44)
AST: 21 IU/L (ref 0–40)
Albumin: 4.6 g/dL (ref 3.8–4.9)
Alkaline Phosphatase: 75 IU/L (ref 47–123)
BUN/Creatinine Ratio: 15 (ref 9–20)
BUN: 17 mg/dL (ref 6–24)
Bilirubin Total: 0.7 mg/dL (ref 0.0–1.2)
CO2: 25 mmol/L (ref 20–29)
Calcium: 9.6 mg/dL (ref 8.7–10.2)
Chloride: 102 mmol/L (ref 96–106)
Creatinine, Ser: 1.1 mg/dL (ref 0.76–1.27)
Globulin, Total: 2 g/dL (ref 1.5–4.5)
Glucose: 104 mg/dL — ABNORMAL HIGH (ref 70–99)
Potassium: 4.4 mmol/L (ref 3.5–5.2)
Sodium: 141 mmol/L (ref 134–144)
Total Protein: 6.6 g/dL (ref 6.0–8.5)
eGFR: 79 mL/min/1.73 (ref >59)

## 2024-04-29 LAB — HEMOGLOBIN A1C
Est. average glucose Bld gHb Est-mCnc: 117 mg/dL
Hgb A1c MFr Bld: 5.7 % — ABNORMAL HIGH (ref 4.8–5.6)

## 2024-04-29 LAB — TESTOSTERONE: Testosterone: 452 ng/dL (ref 264–916)

## 2024-04-29 LAB — LIPID PANEL
Chol/HDL Ratio: 4.5 ratio (ref 0.0–5.0)
Cholesterol, Total: 183 mg/dL (ref 100–199)
HDL: 41 mg/dL (ref >39)
LDL Chol Calc (NIH): 126 mg/dL — ABNORMAL HIGH (ref 0–99)
Triglycerides: 86 mg/dL (ref 0–149)
VLDL Cholesterol Cal: 16 mg/dL (ref 5–40)

## 2024-04-29 LAB — TSH RFX ON ABNORMAL TO FREE T4: TSH: 1.54 u[IU]/mL (ref 0.450–4.500)

## 2024-04-30 ENCOUNTER — Other Ambulatory Visit (HOSPITAL_COMMUNITY): Payer: Self-pay

## 2024-05-06 ENCOUNTER — Ambulatory Visit (HOSPITAL_BASED_OUTPATIENT_CLINIC_OR_DEPARTMENT_OTHER): Payer: Self-pay | Admitting: Family Medicine

## 2024-07-12 ENCOUNTER — Encounter (HOSPITAL_BASED_OUTPATIENT_CLINIC_OR_DEPARTMENT_OTHER): Payer: Self-pay | Admitting: Family Medicine

## 2024-07-12 ENCOUNTER — Other Ambulatory Visit (HOSPITAL_COMMUNITY): Payer: Self-pay

## 2024-07-12 ENCOUNTER — Other Ambulatory Visit (HOSPITAL_BASED_OUTPATIENT_CLINIC_OR_DEPARTMENT_OTHER): Payer: Self-pay | Admitting: Family Medicine

## 2024-07-12 MED ORDER — OLMESARTAN MEDOXOMIL-HCTZ 20-12.5 MG PO TABS
1.0000 | ORAL_TABLET | Freq: Every day | ORAL | 1 refills | Status: AC
  Filled 2024-07-12: qty 90, 90d supply, fill #0

## 2024-07-16 ENCOUNTER — Other Ambulatory Visit (HOSPITAL_COMMUNITY): Payer: Self-pay

## 2024-07-31 ENCOUNTER — Other Ambulatory Visit (HOSPITAL_COMMUNITY): Payer: Self-pay

## 2024-08-03 ENCOUNTER — Other Ambulatory Visit (HOSPITAL_COMMUNITY): Payer: Self-pay

## 2024-08-03 MED ORDER — AMPHET-DEXTROAMPHET 3-BEAD ER 50 MG PO CP24
1.0000 | ORAL_CAPSULE | Freq: Every day | ORAL | 0 refills | Status: AC
  Filled 2024-08-03: qty 90, 90d supply, fill #0

## 2024-08-10 ENCOUNTER — Encounter: Payer: Self-pay | Admitting: Nurse Practitioner

## 2024-08-10 ENCOUNTER — Telehealth: Admitting: Nurse Practitioner

## 2024-08-10 ENCOUNTER — Other Ambulatory Visit (HOSPITAL_COMMUNITY): Payer: Self-pay

## 2024-08-10 DIAGNOSIS — R6889 Other general symptoms and signs: Secondary | ICD-10-CM

## 2024-08-10 DIAGNOSIS — J069 Acute upper respiratory infection, unspecified: Secondary | ICD-10-CM

## 2024-08-10 MED ORDER — FLUTICASONE PROPIONATE 50 MCG/ACT NA SUSP
2.0000 | Freq: Every day | NASAL | 0 refills | Status: AC
  Filled 2024-08-10: qty 16, 30d supply, fill #0

## 2024-08-10 MED ORDER — BENZONATATE 100 MG PO CAPS
100.0000 mg | ORAL_CAPSULE | Freq: Three times a day (TID) | ORAL | 0 refills | Status: AC | PRN
  Filled 2024-08-10: qty 20, 4d supply, fill #0

## 2024-08-10 NOTE — Progress Notes (Signed)
 " Virtual Visit Consent   Tom Wilson, you are scheduled for a virtual visit with a [facility] provider today. Just as with appointments in the office, your consent must be obtained to participate. Your consent will be active for this visit and any virtual visit you may have with one of our providers in the next 365 days. If you have a MyChart account, a copy of this consent can be sent to you electronically.  As this is a virtual visit, video technology does not allow for your provider to perform a traditional examination. This may limit your provider's ability to fully assess your condition. If your provider identifies any concerns that need to be evaluated in person or the need to arrange testing (such as labs, EKG, etc.), we will make arrangements to do so. Although advances in technology are sophisticated, we cannot ensure that it will always work on either your end or our end. If the connection with a video visit is poor, the visit may have to be switched to a telephone visit. With either a video or telephone visit, we are not always able to ensure that we have a secure connection.  By engaging in this virtual visit, you consent to the provision of healthcare and authorize for your insurance to be billed (if applicable) for the services provided during this visit. Depending on your insurance coverage, you may receive a charge related to this service.  I need to obtain your verbal consent now. Are you willing to proceed with your visit today? Tom Wilson has provided verbal consent on 08/10/2024 for a virtual visit (video or telephone). Comer LULLA Rouleau, NP  Date: 08/10/2024 1:06 PM   Virtual Visit via Video Note   I, Comer LULLA Rouleau, connected with  Tom Wilson  ([id number], 05-14-1971) on 08/10/24 at 12:45 PM EST by a video-enabled telemedicine application and verified that I am speaking with the correct person using two identifiers.  Location: Patient: Virtual Visit  Location Patient: Home Provider: Virtual Visit Location Provider: Home Office   I discussed the limitations of evaluation and management by telemedicine and the availability of in person appointments. The patient expressed understanding and agreed to proceed.    History of Present Illness: Javante Vincen Bejar is a 54 y.o. who identifies as a male who was assigned male at birth, and is being seen today for flu like symptoms.  Started three days ago Sore throat, sinus congestion and yellow/green drainage  Coughing with yellow/green phlegm   Feels feverish but has not taken his temperature  Body aches  No headaches, ear pain, shortness of breath, wheezing, CP, N/V, diarrhea, weakness  Able to keep fluids down and has been ensuring he is staying hydrated Concerned he has the flu. Has not completed any viral testing. No known sick exposures He has been taking Theraflu to help with symptoms, as well as Advil  No hx of asthma or pulmonary disease   HPI: HPI  Problems:  Patient Active Problem List   Diagnosis Date Noted   Wellness examination 04/20/2024   Primary hypertension 05/27/2023   Erectile dysfunction 05/27/2023   Pneumonia due to COVID-19 virus 10/27/2019   Hypoxia 10/27/2019   Lipoma of back - 5 cm 03/11/2013    Allergies: Allergies[1] Medications: Current Medications[2]  Observations/Objective: Patient is well-developed, well-nourished in no acute distress.  Resting comfortably at home.  Head is normocephalic, atraumatic.  No labored breathing.  Speech is clear and coherent with logical content. Congested voice  Patient is alert and oriented at baseline.   Assessment and Plan: 1. Flu-like symptoms (Primary) - fluticasone  (FLONASE ) 50 MCG/ACT nasal spray; Place 2 sprays into both nostrils daily.  Dispense: 16 g; Refill: 0 - benzonatate  (TESSALON ) 100 MG capsule; Take 1-2 capsules (100-200 mg total) by mouth 3 (three) times daily as needed for cough.  Dispense: 20 capsule;  Refill: 0  2. Viral upper respiratory tract infection  Advised to complete at home viral testing and notify of positive results. Consider antiviral, if positive. Supportive care measures encouraged. Provided rx for intranasal steroid and Tessalon  to help manage symptoms. Reviewed OTC therapies. Reviewed s/s necessitating in person evaluation and red flag symptoms. Encouraged to push fluids and rest.   Follow Up Instructions: I discussed the assessment and treatment plan with the patient. The patient was provided an opportunity to ask questions and all were answered. The patient agreed with the plan and demonstrated an understanding of the instructions.  A copy of instructions were sent to the patient via MyChart unless otherwise noted below.    The patient was advised to call back or seek an in-person evaluation if the symptoms worsen or if the condition fails to improve as anticipated.    Comer LULLA Rouleau, NP     [1] No Known Allergies [2]  Current Outpatient Medications:    Amphet-Dextroamphet 3-Bead ER 50 MG CP24, Take 1 capsule by mouth daily, Disp: 90 capsule, Rfl: 0   ascorbic acid  (VITAMIN C) 500 MG tablet, Take 1 tablet (500 mg total) by mouth daily., Disp: 30 tablet, Rfl: 0   aspirin 81 MG chewable tablet, Chew 81 mg by mouth daily., Disp: , Rfl:    benzonatate  (TESSALON ) 100 MG capsule, Take 1-2 capsules (100-200 mg total) by mouth 3 (three) times daily as needed for cough., Disp: 20 capsule, Rfl: 0   Cholecalciferol (HM VITAMIN D3) 100 MCG (4000 UT) CAPS, Take 4,000 Units by mouth daily., Disp: , Rfl:    fluticasone  (FLONASE ) 50 MCG/ACT nasal spray, Place 2 sprays into both nostrils daily., Disp: 16 g, Rfl: 0   olmesartan -hydrochlorothiazide  (BENICAR  HCT) 20-12.5 MG tablet, Take 1 tablet by mouth daily., Disp: 90 tablet, Rfl: 1   Sod Picosulfate-Mag Ox-Cit Acd (CLENPIQ ) 10-3.5-12 MG-GM -GM/175ML SOLN, Use as directed per office instructions and not instructions on packaging.,  Disp: 350 mL, Rfl: 0   VITAMIN E PO, Take 1 tablet by mouth daily., Disp: , Rfl:    zinc  sulfate 220 (50 Zn) MG capsule, Take 1 capsule (220 mg total) by mouth daily., Disp: 30 capsule, Rfl: 0  "

## 2024-08-10 NOTE — Patient Instructions (Signed)
 " Tom Wilson, thank you for joining Comer LULLA Rouleau, NP for today's virtual visit.  While this provider is not your primary care provider (PCP), if your PCP is located in our provider database this encounter information will be shared with them immediately following your visit.   A [facility] MyChart account gives you access to today's visit and all your visits, tests, and labs performed at [facility]  click here if you don't have a [facility] MyChart account or go to [email or web address]  Consent: (Patient) Tom Wilson provided verbal consent for this virtual visit at the beginning of the encounter.  Current Medications:  Current Outpatient Medications:    Amphet-Dextroamphet 3-Bead ER 50 MG CP24, Take 1 capsule by mouth daily, Disp: 90 capsule, Rfl: 0   ascorbic acid  (VITAMIN C) 500 MG tablet, Take 1 tablet (500 mg total) by mouth daily., Disp: 30 tablet, Rfl: 0   aspirin 81 MG chewable tablet, Chew 81 mg by mouth daily., Disp: , Rfl:    benzonatate  (TESSALON ) 100 MG capsule, Take 1-2 capsules (100-200 mg total) by mouth 3 (three) times daily as needed for cough., Disp: 20 capsule, Rfl: 0   Cholecalciferol (HM VITAMIN D3) 100 MCG (4000 UT) CAPS, Take 4,000 Units by mouth daily., Disp: , Rfl:    fluticasone  (FLONASE ) 50 MCG/ACT nasal spray, Place 2 sprays into both nostrils daily., Disp: 18.2 mL, Rfl: 0   olmesartan -hydrochlorothiazide  (BENICAR  HCT) 20-12.5 MG tablet, Take 1 tablet by mouth daily., Disp: 90 tablet, Rfl: 1   Sod Picosulfate-Mag Ox-Cit Acd (CLENPIQ ) 10-3.5-12 MG-GM -GM/175ML SOLN, Use as directed per office instructions and not instructions on packaging., Disp: 350 mL, Rfl: 0   VITAMIN E PO, Take 1 tablet by mouth daily., Disp: , Rfl:    zinc  sulfate 220 (50 Zn) MG capsule, Take 1 capsule (220 mg total) by mouth daily., Disp: 30 capsule, Rfl: 0   Medications ordered in this encounter:  Meds ordered this encounter  Medications    fluticasone  (FLONASE ) 50 MCG/ACT nasal spray    Sig: Place 2 sprays into both nostrils daily.    Dispense:  18.2 mL    Refill:  0   benzonatate  (TESSALON ) 100 MG capsule    Sig: Take 1-2 capsules (100-200 mg total) by mouth 3 (three) times daily as needed for cough.    Dispense:  20 capsule    Refill:  0     *If you need refills on other medications prior to your next appointment, please contact your pharmacy*  Follow-Up: Call back or seek an in-person evaluation if the symptoms worsen or if the condition fails to improve as anticipated.   Other Instructions Based on what you have shared with me it looks like you may have a respiratory virus that may be influenza.  Influenza or the flu is  an infection caused by a respiratory virus. The flu virus is highly contagious and persons who did not receive their yearly flu vaccination may catch the flu from close contact.  We have anti-viral medications to treat the viruses that cause this infection. They are not a cure and only shorten the course of the infection. Please take an at home Flu/COVID test and notify us  of positive results.   For nasal congestion, you may use an oral decongestant such as Mucinex  D or if you have glaucoma or high blood pressure use plain Mucinex .  Saline nasal spray or nasal drops can help and can safely be used as often  as needed for congestion.  If you have a sore or scratchy throat, use a saltwater gargle-  to  teaspoon of salt dissolved in a 4-ounce to 8-ounce glass of warm water.  Gargle the solution for approximately 15-30 seconds and then spit.  It is important not to swallow the solution.  You can also use throat lozenges/cough drops and Chloraseptic spray to help with throat pain or discomfort.  Warm or cold liquids can also be helpful in relieving throat pain.  For headache, pain or general discomfort, you can use Ibuprofen as directed. Theraflu, which you have been taking, also has acetaminophen   (Tylenol ) in it so do not take additional tylenol  while taking this.  Some authorities believe that zinc  sprays or the use of Echinacea may shorten the course of your symptoms.  I have prescribed the following medications to help lessen symptoms: I have prescribed Tessalon  Perles 100 mg. You may take 1-2 capsules every 8 hours as needed for cough and I have prescribed Fluticasone  nasal spray 2 sprays in each nostril one time per day  You are to isolate at home until you have been fever-free for at least 24 hours without a fever-reducing medication, and symptoms have been steadily improving for 24 hours.  If you must be around other household members who do not have symptoms, you need to make sure that both you and the family members are masking consistently with a high-quality mask.  If you note any worsening of symptoms despite treatment, please seek an in-person evaluation ASAP. If you note any significant shortness of breath or any chest pain, please seek ED evaluation. Please do not delay care!  ANYONE WHO HAS FLU SYMPTOMS SHOULD: Stay home. The flu is highly contagious and going out or to work exposes others! Be sure to drink plenty of fluids. Water is fine as well as fruit juices, sodas and electrolyte beverages. You may want to stay away from caffeine or alcohol. If you are nauseated, try taking small sips of liquids. How do you know if you are getting enough fluid? Your urine should be a pale yellow or almost colorless. Get rest. Taking a steamy shower or using a humidifier may help nasal congestion and ease sore throat pain. Using a saline nasal spray works much the same way. Cough drops, hard candies and sore throat lozenges may ease your cough. Line up a caregiver. Have someone check on you regularly.  GET HELP RIGHT AWAY IF: You cannot keep down liquids or your medications. You become short of breath Your fell like you are going to pass out or loose consciousness. Your symptoms  persist after you have completed your treatment plan    If you have been instructed to have an in-person evaluation today at a local Urgent Care facility, please use the link below. It will take you to a list of all of our available [facility], including address, phone number and hours of operation. Please do not delay care.  [facility]  If you or a family member do not have a primary care provider, use the link below to schedule a visit and establish care. When you choose a [facility] primary care physician or advanced practice provider, you gain a long-term partner in health. Find a Primary Care Provider  Learn more about [facility] in-office and virtual care options: [place] [facility] - Get Care Now  "

## 2024-09-21 ENCOUNTER — Ambulatory Visit (HOSPITAL_BASED_OUTPATIENT_CLINIC_OR_DEPARTMENT_OTHER): Admitting: Family Medicine
